# Patient Record
Sex: Male | Born: 1945 | Race: Black or African American | Hispanic: No | Marital: Single | Smoking: Current every day smoker
Health system: Southern US, Community
[De-identification: ages and names within clinical notes are randomized; demographics above are authoritative.]

## PROBLEM LIST (undated history)

## (undated) DIAGNOSIS — I509 Heart failure, unspecified: Secondary | ICD-10-CM

## (undated) DIAGNOSIS — I10 Essential (primary) hypertension: Secondary | ICD-10-CM

## (undated) DIAGNOSIS — B159 Hepatitis A without hepatic coma: Secondary | ICD-10-CM

## (undated) HISTORY — DX: Heart failure, unspecified: I50.9

## (undated) HISTORY — PX: LACERATION REPAIR: SHX5168

## (undated) HISTORY — PX: APPENDECTOMY: SHX54

---

## 2015-05-27 ENCOUNTER — Emergency Department: Payer: Medicare Other

## 2015-05-27 ENCOUNTER — Inpatient Hospital Stay
Admission: EM | Admit: 2015-05-27 | Discharge: 2015-05-30 | DRG: 291 | Disposition: A | Discharge status: Skilled Nursing Facility | Payer: Medicare Other | Attending: Internal Medicine | Admitting: Internal Medicine

## 2015-05-27 DIAGNOSIS — R55 Syncope and collapse: Secondary | ICD-10-CM

## 2015-05-27 DIAGNOSIS — I420 Dilated cardiomyopathy: Secondary | ICD-10-CM

## 2015-05-27 DIAGNOSIS — B159 Hepatitis A without hepatic coma: Secondary | ICD-10-CM | POA: Diagnosis present

## 2015-05-27 DIAGNOSIS — F1010 Alcohol abuse, uncomplicated: Secondary | ICD-10-CM | POA: Diagnosis present

## 2015-05-27 DIAGNOSIS — R748 Abnormal levels of other serum enzymes: Secondary | ICD-10-CM | POA: Diagnosis present

## 2015-05-27 DIAGNOSIS — I5023 Acute on chronic systolic (congestive) heart failure: Secondary | ICD-10-CM | POA: Diagnosis present

## 2015-05-27 DIAGNOSIS — F17210 Nicotine dependence, cigarettes, uncomplicated: Secondary | ICD-10-CM | POA: Diagnosis present

## 2015-05-27 DIAGNOSIS — I509 Heart failure, unspecified: Secondary | ICD-10-CM

## 2015-05-27 DIAGNOSIS — Z9119 Patient's noncompliance with other medical treatment and regimen: Secondary | ICD-10-CM | POA: Diagnosis not present

## 2015-05-27 DIAGNOSIS — I110 Hypertensive heart disease with heart failure: Secondary | ICD-10-CM | POA: Diagnosis present

## 2015-05-27 DIAGNOSIS — Z6820 Body mass index (BMI) 20.0-20.9, adult: Secondary | ICD-10-CM | POA: Diagnosis not present

## 2015-05-27 DIAGNOSIS — R0600 Dyspnea, unspecified: Secondary | ICD-10-CM

## 2015-05-27 DIAGNOSIS — R7989 Other specified abnormal findings of blood chemistry: Secondary | ICD-10-CM

## 2015-05-27 DIAGNOSIS — I10 Essential (primary) hypertension: Secondary | ICD-10-CM

## 2015-05-27 DIAGNOSIS — R778 Other specified abnormalities of plasma proteins: Secondary | ICD-10-CM

## 2015-05-27 DIAGNOSIS — I739 Peripheral vascular disease, unspecified: Secondary | ICD-10-CM | POA: Diagnosis present

## 2015-05-27 DIAGNOSIS — E876 Hypokalemia: Secondary | ICD-10-CM

## 2015-05-27 DIAGNOSIS — E43 Unspecified severe protein-calorie malnutrition: Secondary | ICD-10-CM | POA: Diagnosis present

## 2015-05-27 DIAGNOSIS — J159 Unspecified bacterial pneumonia: Secondary | ICD-10-CM | POA: Diagnosis present

## 2015-05-27 DIAGNOSIS — Z720 Tobacco use: Secondary | ICD-10-CM

## 2015-05-27 HISTORY — DX: Essential (primary) hypertension: I10

## 2015-05-27 HISTORY — DX: Hepatitis a without hepatic coma: B15.9

## 2015-05-27 LAB — COMPREHENSIVE METABOLIC PANEL
ALK PHOS: 52 U/L (ref 38–126)
ALT: 21 U/L (ref 17–63)
AST: 38 U/L (ref 15–41)
Albumin: 3.1 g/dL — ABNORMAL LOW (ref 3.5–5.0)
Anion gap: 6 (ref 5–15)
BILIRUBIN TOTAL: 0.9 mg/dL (ref 0.3–1.2)
BUN: 23 mg/dL — AB (ref 6–20)
CO2: 30 mmol/L (ref 22–32)
Calcium: 8.9 mg/dL (ref 8.9–10.3)
Chloride: 102 mmol/L (ref 101–111)
Creatinine, Ser: 0.84 mg/dL (ref 0.61–1.24)
Glucose, Bld: 90 mg/dL (ref 65–99)
Potassium: 4.2 mmol/L (ref 3.5–5.1)
Sodium: 138 mmol/L (ref 135–145)
Total Protein: 7.1 g/dL (ref 6.5–8.1)

## 2015-05-27 LAB — URINALYSIS COMPLETE WITH MICROSCOPIC (ARMC ONLY)
BACTERIA UA: NONE SEEN
Bilirubin Urine: NEGATIVE
Glucose, UA: NEGATIVE mg/dL
KETONES UR: NEGATIVE mg/dL
Leukocytes, UA: NEGATIVE
NITRITE: NEGATIVE
PH: 6 (ref 5.0–8.0)
PROTEIN: 100 mg/dL — AB
Specific Gravity, Urine: 1.023 (ref 1.005–1.030)

## 2015-05-27 LAB — CBC
HCT: 40.4 % (ref 40.0–52.0)
HEMOGLOBIN: 13.6 g/dL (ref 13.0–18.0)
MCH: 32 pg (ref 26.0–34.0)
MCHC: 33.6 g/dL (ref 32.0–36.0)
MCV: 95.1 fL (ref 80.0–100.0)
Platelets: 81 10*3/uL — ABNORMAL LOW (ref 150–440)
RBC: 4.24 MIL/uL — AB (ref 4.40–5.90)
RDW: 15.8 % — ABNORMAL HIGH (ref 11.5–14.5)
WBC: 5.4 10*3/uL (ref 3.8–10.6)

## 2015-05-27 LAB — CREATININE, SERUM: CREATININE: 0.91 mg/dL (ref 0.61–1.24)

## 2015-05-27 LAB — CBC WITH DIFFERENTIAL/PLATELET
Basophils Absolute: 0 10*3/uL (ref 0–0.1)
Eosinophils Absolute: 0 10*3/uL (ref 0–0.7)
Eosinophils Relative: 0 %
HEMATOCRIT: 37.7 % — AB (ref 40.0–52.0)
HEMOGLOBIN: 12.1 g/dL — AB (ref 13.0–18.0)
LYMPHS ABS: 1.3 10*3/uL (ref 1.0–3.6)
MCH: 30.8 pg (ref 26.0–34.0)
MCHC: 32.1 g/dL (ref 32.0–36.0)
MCV: 96 fL (ref 80.0–100.0)
Monocytes Absolute: 0.5 10*3/uL (ref 0.2–1.0)
NEUTROS ABS: 2.3 10*3/uL (ref 1.4–6.5)
Platelets: 68 10*3/uL — ABNORMAL LOW (ref 150–440)
RBC: 3.93 MIL/uL — ABNORMAL LOW (ref 4.40–5.90)
RDW: 16 % — ABNORMAL HIGH (ref 11.5–14.5)
WBC: 4.1 10*3/uL (ref 3.8–10.6)

## 2015-05-27 LAB — BRAIN NATRIURETIC PEPTIDE: B Natriuretic Peptide: 1394 pg/mL — ABNORMAL HIGH (ref 0.0–100.0)

## 2015-05-27 LAB — TROPONIN I
TROPONIN I: 0.08 ng/mL — AB (ref <0.031)
Troponin I: 0.09 ng/mL — ABNORMAL HIGH (ref <0.031)
Troponin I: 0.09 ng/mL — ABNORMAL HIGH (ref <0.031)

## 2015-05-27 MED ORDER — CARVEDILOL 3.125 MG PO TABS
3.1250 mg | ORAL_TABLET | Freq: Two times a day (BID) | ORAL | Status: DC
  Administered 2015-05-28 (×2): 3.125 mg via ORAL
  Filled 2015-05-27 (×2): qty 1

## 2015-05-27 MED ORDER — DOCUSATE SODIUM 100 MG PO CAPS
100.0000 mg | ORAL_CAPSULE | Freq: Two times a day (BID) | ORAL | Status: DC
  Administered 2015-05-27 – 2015-05-30 (×6): 100 mg via ORAL
  Filled 2015-05-27 (×6): qty 1

## 2015-05-27 MED ORDER — ONDANSETRON HCL 4 MG/2ML IJ SOLN
4.0000 mg | Freq: Four times a day (QID) | INTRAMUSCULAR | Status: DC | PRN
  Administered 2015-05-28 – 2015-05-30 (×5): 4 mg via INTRAVENOUS
  Filled 2015-05-27 (×5): qty 2

## 2015-05-27 MED ORDER — CLOPIDOGREL BISULFATE 75 MG PO TABS
300.0000 mg | ORAL_TABLET | Freq: Once | ORAL | Status: AC
  Administered 2015-05-27: 300 mg via ORAL
  Filled 2015-05-27: qty 4

## 2015-05-27 MED ORDER — SODIUM CHLORIDE 0.9 % IJ SOLN
3.0000 mL | INTRAMUSCULAR | Status: DC | PRN
  Administered 2015-05-29 – 2015-05-30 (×3): 3 mL via INTRAVENOUS
  Filled 2015-05-27 (×3): qty 10

## 2015-05-27 MED ORDER — ONDANSETRON HCL 4 MG PO TABS: 4.0000 mg | ORAL_TABLET | Freq: Four times a day (QID) | ORAL | Status: DC | PRN

## 2015-05-27 MED ORDER — ADULT MULTIVITAMIN W/MINERALS CH
1.0000 | ORAL_TABLET | Freq: Every day | ORAL | Status: DC
  Administered 2015-05-27 – 2015-05-30 (×4): 1 via ORAL
  Filled 2015-05-27 (×4): qty 1

## 2015-05-27 MED ORDER — LORAZEPAM 0.5 MG PO TABS
0.5000 mg | ORAL_TABLET | Freq: Once | ORAL | Status: AC
  Administered 2015-05-27: 0.5 mg via ORAL
  Filled 2015-05-27: qty 1

## 2015-05-27 MED ORDER — SODIUM CHLORIDE 0.9 % IV SOLN: Freq: Once | INTRAVENOUS | Status: DC

## 2015-05-27 MED ORDER — SODIUM CHLORIDE 0.9 % IV SOLN: 250.0000 mL | INTRAVENOUS | Status: DC | PRN

## 2015-05-27 MED ORDER — ENOXAPARIN SODIUM 40 MG/0.4ML ~~LOC~~ SOLN: 40.0000 mg | SUBCUTANEOUS | Status: DC

## 2015-05-27 MED ORDER — ASPIRIN 81 MG PO CHEW
324.0000 mg | CHEWABLE_TABLET | Freq: Once | ORAL | Status: AC
  Administered 2015-05-27: 324 mg via ORAL
  Filled 2015-05-27: qty 4

## 2015-05-27 MED ORDER — FUROSEMIDE 10 MG/ML IJ SOLN
20.0000 mg | Freq: Two times a day (BID) | INTRAMUSCULAR | Status: DC
  Administered 2015-05-27 – 2015-05-29 (×4): 20 mg via INTRAVENOUS
  Filled 2015-05-27 (×4): qty 2

## 2015-05-27 MED ORDER — ACETAMINOPHEN 325 MG PO TABS
650.0000 mg | ORAL_TABLET | Freq: Four times a day (QID) | ORAL | Status: DC | PRN
  Administered 2015-05-27: 650 mg via ORAL
  Filled 2015-05-27: qty 2

## 2015-05-27 MED ORDER — ACETAMINOPHEN 650 MG RE SUPP: 650.0000 mg | Freq: Four times a day (QID) | RECTAL | Status: DC | PRN

## 2015-05-27 MED ORDER — SENNOSIDES-DOCUSATE SODIUM 8.6-50 MG PO TABS: 1.0000 | ORAL_TABLET | Freq: Every evening | ORAL | Status: DC | PRN

## 2015-05-27 MED ORDER — NITROGLYCERIN 2 % TD OINT
0.5000 [in_us] | TOPICAL_OINTMENT | Freq: Once | TRANSDERMAL | Status: AC
  Administered 2015-05-27: 0.5 [in_us] via TOPICAL
  Filled 2015-05-27: qty 1

## 2015-05-27 MED ORDER — FUROSEMIDE 10 MG/ML IJ SOLN
40.0000 mg | Freq: Once | INTRAMUSCULAR | Status: AC
Start: 2015-05-27 — End: 2015-05-27
  Administered 2015-05-27: 40 mg via INTRAVENOUS
  Filled 2015-05-27: qty 4

## 2015-05-27 MED ORDER — SODIUM CHLORIDE 0.9 % IJ SOLN
3.0000 mL | Freq: Two times a day (BID) | INTRAMUSCULAR | Status: DC
  Administered 2015-05-27 – 2015-05-30 (×6): 3 mL via INTRAVENOUS

## 2015-05-27 MED ORDER — ALUM & MAG HYDROXIDE-SIMETH 200-200-20 MG/5ML PO SUSP: 30.0000 mL | Freq: Four times a day (QID) | ORAL | Status: DC | PRN

## 2015-05-27 MED ORDER — ENOXAPARIN SODIUM 80 MG/0.8ML ~~LOC~~ SOLN
1.0000 mg/kg | Freq: Once | SUBCUTANEOUS | Status: AC
  Administered 2015-05-27: 65 mg via SUBCUTANEOUS
  Filled 2015-05-27: qty 0.8

## 2015-05-27 MED ORDER — LISINOPRIL 5 MG PO TABS
5.0000 mg | ORAL_TABLET | Freq: Every day | ORAL | Status: DC
  Administered 2015-05-27 – 2015-05-28 (×2): 5 mg via ORAL
  Filled 2015-05-27 (×2): qty 1

## 2015-05-27 NOTE — ED Notes (Signed)
Patient transported to CT 

## 2015-05-27 NOTE — ED Notes (Signed)
Pt bib EMS from home w/ c/o syncopable episode.  Per EMS, pt was at kitchen table when family reports he LOC and began drooling.  Pt sts that he fell asleep.  Per EMS, family was DC from [facility] yesterday for HTN.  Pt denies pain and sts that he simply fell asleep.  Pt sts he does not remember losing consciousness or family calling EMS.

## 2015-05-27 NOTE — ED Provider Notes (Signed)
[facility] Emergency Department Provider Note  ____________________________________________  Time seen: Approximately 12:05 PM  I have reviewed the triage vital signs and the nursing notes.   HISTORY  Chief Complaint Loss of Consciousness    HPI Alex Hill is a 69 y.o. male who came in from home. Reportedly he was sitting at the table had finished eating and slumped over and began drooling. I'm uncertain as to how long he was out. Patient himself reports that he just wanted to sleep and had gone to sleep. Patient reports he drools when he sleeps sometimes. As I understand that he was not confused when he woke up. History is somewhat limited by the lack of any witnesses at the present time. Patient was a recent discharge from the [place] for hypertension. Patient denies any shortness of breath chest pain or any other discomfort anytime the last week or 2. Patient does report his legs been swelling for about the last 2 weeks. Patient reports she has not done this before. Nothing seemed to bring it on he had no premonitory symptoms except that he felt sleepy. He had no other associated symptoms.  Past Medical History  Diagnosis Date  . Hypertension   . Hepatitis A     There are no active problems to display for this patient.   Past Surgical History  Procedure Laterality Date  . Appendectomy      No current outpatient prescriptions on file.  Allergies Review of patient's allergies indicates no known allergies.  No family history on file.  Social History Social History  Substance Use Topics  . Smoking status: Current Every Day Smoker -- 1.00 packs/day    Types: Cigarettes  . Smokeless tobacco: None  . Alcohol Use: None    Review of Systems Constitutional: No fever/chills Eyes: No visual changes. ENT: No sore throat. Cardiovascular: Denies chest pain. Respiratory: Denies shortness of breath. Gastrointestinal: No abdominal pain.  No nausea,  no vomiting.  No diarrhea.  No constipation. Genitourinary: Negative for dysuria. Musculoskeletal: Negative for back pain. Skin: Negative for rash. Neurological: Negative for headaches, focal weakness or numbness.  10-point ROS otherwise negative.  ____________________________________________   PHYSICAL EXAM:  VITAL SIGNS: ED Triage Vitals  Enc Vitals Group     BP 05/27/15 1157 143/79 mmHg     Pulse Rate 05/27/15 1157 80     Resp 05/27/15 1157 25     Temp 05/27/15 1157 98.4 F (36.9 C)     Temp Source 05/27/15 1157 Oral     SpO2 05/27/15 1157 100 %     Weight 05/27/15 1157 145 lb (65.772 kg)     Height --      Head Cir --      Peak Flow --      Pain Score --      Pain Loc --      Pain Edu? --      Excl. in GC? --     Constitutional: Alert and oriented. Well appearing and in no acute distress. Eyes: Conjunctivae are normal. PERRL. EOMI. Head: Atraumatic. Nose: No congestion/rhinnorhea. Mouth/Throat: Mucous membranes are moist.  Oropharynx non-erythematous. Neck: No stridor.  Cardiovascular: Normal rate, regular rhythm. Grossly normal heart sounds.  Good peripheral circulation. Respiratory: Normal respiratory effort.  No retractions. Lungs CTAB. Gastrointestinal: Soft and nontender. No distention. No abdominal bruits. No CVA tenderness. Musculoskeletal: No lower extremity tenderness nor edema.  No joint effusions. Neurologic:  Normal speech and language. No gross focal neurologic deficits are  appreciated. Cranial nerves II through XII appear to be intact cerebellar finger-nose is perhaps slightly slowed but rapid alternating movements are normal the cerebellar signs are symmetrical bilaterally. There is no motor weakness. There are no sensory changes. No gait instability. Skin:  Skin is warm, dry and intact. No rash noted. Psychiatric: Mood and affect are normal. Speech and behavior are normal.  ____________________________________________   LABS (all labs ordered are  listed, but only abnormal results are displayed)  Labs Reviewed  COMPREHENSIVE METABOLIC PANEL - Abnormal; Notable for the following:    BUN 23 (*)    Albumin 3.1 (*)    All other components within normal limits  TROPONIN I - Abnormal; Notable for the following:    Troponin I 0.09 (*)    All other components within normal limits  CBC WITH DIFFERENTIAL/PLATELET - Abnormal; Notable for the following:    RBC 3.93 (*)    Hemoglobin 12.1 (*)    HCT 37.7 (*)    RDW 16.0 (*)    Platelets 68 (*)    All other components within normal limits  URINALYSIS COMPLETEWITH MICROSCOPIC ([facility] ONLY) - Abnormal; Notable for the following:    Color, Urine AMBER (*)    APPearance CLEAR (*)    Hgb urine dipstick 1+ (*)    Protein, ur 100 (*)    Squamous Epithelial / LPF 0-5 (*)    All other components within normal limits  BRAIN NATRIURETIC PEPTIDE - Abnormal; Notable for the following:    B Natriuretic Peptide 1394.0 (*)    All other components within normal limits  TROPONIN I - Abnormal; Notable for the following:    Troponin I 0.09 (*)    All other components within normal limits   ____________________________________________  EKG  EKG read and interpreted by me shows normal sinus rhythm a rate of 91 normal axis patient has ST segment depression T-wave inversion in II, III, and F with ST elevation in V3 only. Patient also has LVH. No old EKGs are present chest x-ray EKG #2 read and interpreted by me shows normal sinus rhythm rate of 90 normal axis some improvement of the ST segment depression and T-wave inversion inferiorly. The ST elevation in V3 only remains present. ___________________________________________  RADIOLOGY  Chest x-ray read and interpreted by me shows congestive heart failure Patient's head CT showed no acute disease ____________________________________________   PROCEDURES     ____________________________________________   INITIAL IMPRESSION / ASSESSMENT AND PLAN /  ED COURSE  Pertinent labs & imaging results that were available during my care of the patient were reviewed by me and considered in my medical decision making (see chart for details). [facility] it was not able to do this. Patient's troponin was elevated patient's had said his legs began swelling 2 weeks ago still unable to contact the [facility] when I asked him again. We'll attempt to transfer the patient ----------------------------------------- 5:44 PM on 05/27/2015 -----------------------------------------  Patient now says he does not want to go the [facility] would rather stay here to much more convenient for his family. He understands and his family understands that the [facility] might not pay for his stay here. ____________________________________________   FINAL CLINICAL IMPRESSION(S) / ED DIAGNOSES  Final diagnoses:  Acute congestive heart failure, unspecified congestive heart failure type (HCC)  Elevated troponin  Collapse      Arnaldo NatalPaul F Malinda, MD 05/27/15 1744

## 2015-05-27 NOTE — H&P (Signed)
[facility]   PATIENT NAME: Alex Hill    MR#:  [id number]  DATE OF BIRTH:  Dec 22, 1945  DATE OF ADMISSION:  05/27/2015  [facility]   REQUESTING/REFERRING PHYSICIAN: DR Alphonzo Lemmings  CHIEF COMPLAINT:  Shortness of breath leg swelling on and off for 1 month  HISTORY OF PRESENT ILLNESS:  Alex Hill  is a 69 y.o. male with a known history of uncontrolled hypertension not on any medication secondary to noncompliance, alcohol abuse, tobacco abuse comes to the emergency room brought in by daughter and niece with increasing shortness of breath and bilateral lower extremity swelling. Patient was just admitted at the interim apparently he left AMA and does not have any blood pressure medications that as prescribed. He has been noncompliant to primary care physician follow-up per family.  In the ER patient was found to have elevated BNP chest x-ray congestive heart failure along with bilateral lower extremity edema is consistent with new onset congestive heart failure unknown ejection fraction. Patient was also noted to have elevated blood pressure. He received IV Lasix in the emergency room. He is a borderline elevated troponin of 0.09. Denies any chest pain at present.   PAST MEDICAL HISTORY:   Past Medical History  Diagnosis Date  . Hypertension   . Hepatitis A     PAST SURGICAL HISTOIRY:   Past Surgical History  Procedure Laterality Date  . Appendectomy      SOCIAL HISTORY:   Social History  Substance Use Topics  . Smoking status: Current Every Day Smoker -- 1.00 packs/day    Types: Cigarettes  . Smokeless tobacco: Not on file  . Alcohol Use: Not on file    FAMILY HISTORY:  No family history on file.  DRUG ALLERGIES:  No Known Allergies  REVIEW OF SYSTEMS:  Review of Systems  Constitutional: Positive for malaise/fatigue. Negative for fever, chills and weight loss.  HENT: Negative for ear  discharge, ear pain and nosebleeds.   Eyes: Negative for blurred vision, pain and discharge.  Respiratory: Positive for shortness of breath. Negative for sputum production, wheezing and stridor.   Cardiovascular: Positive for leg swelling. Negative for chest pain, palpitations, orthopnea and PND.  Gastrointestinal: Negative for nausea, vomiting, abdominal pain and diarrhea.  Genitourinary: Negative for urgency and frequency.  Musculoskeletal: Negative for back pain and joint pain.  Neurological: Positive for weakness. Negative for sensory change, speech change and focal weakness.  Psychiatric/Behavioral: Negative for depression and hallucinations. The patient is not nervous/anxious.   All other systems reviewed and are negative.    MEDICATIONS AT HOME:   Prior to Admission medications   Not on File      VITAL SIGNS:  Blood pressure 158/57, pulse 90, temperature 98.4 F (36.9 C), temperature source Oral, resp. rate 39, weight 65.772 kg (145 lb), SpO2 100 %.  PHYSICAL EXAMINATION:  GENERAL:  69 y.o.-year-old patient lying in the bed with no acute distress. Appears weak and he'll  EYES: Pupils equal, round, reactive to light and accommodation. No scleral icterus. Extraocular muscles intact.  HEENT: Head atraumatic, normocephalic. Oropharynx and nasopharynx clear.  NECK:  Supple, no jugular venous distention. No thyroid enlargement, no tenderness.  LUNGS: Decreased breath sounds bilaterally, no wheezing, rales,rhonchi or crepitation. No use of accessory muscles of respiration.  CARDIOVASCULAR: S1, S2 normal. No murmurs, rubs, or gallops.  ABDOMEN: Soft, nontender, nondistended. Bowel sounds present. No organomegaly or mass.  EXTREMITIES: +++ pedal edema, no cyanosis, or  clubbing.  NEUROLOGIC: Cranial nerves II through XII are intact. Muscle strength 5/5 in all extremities. Sensation intact. Gait not checked.  PSYCHIATRIC:  patient is alert and oriented x 3.  SKIN: No obvious rash,  lesion, or ulcer.   LABORATORY PANEL:   CBC  Recent Labs Lab 05/27/15 1155  WBC 4.1  HGB 12.1*  HCT 37.7*  PLT 68*   Chemistries   Recent Labs Lab 05/27/15 1155  NA 138  K 4.2  CL 102  CO2 30  GLUCOSE 90  BUN 23*  CREATININE 0.84  CALCIUM 8.9  AST 38  ALT 21  ALKPHOS 52  BILITOT 0.9   ------------------------------------------------------------------------------------------------------------------  Cardiac Enzymes  Recent Labs Lab 05/27/15 1613  TROPONINI 0.09*   ------------------------------------------------------------------------------------------------------------------  RADIOLOGY:  Ct Head Wo Contrast  05/27/2015  CLINICAL DATA:  Syncope and right facial droop EXAM: CT HEAD WITHOUT CONTRAST TECHNIQUE: Contiguous axial images were obtained from the base of the skull through the vertex without intravenous contrast. COMPARISON:  None. FINDINGS: Skull and Sinuses:Negative for fracture or destructive process. The mastoids, middle ears, and imaged paranasal sinuses are clear. Orbits: No acute abnormality. Brain: No evidence of acute infarction, hemorrhage, hydrocephalus, or mass lesion/mass effect. Notable given history of syncope, note that there is moderate streak artifact across the pons, limiting evaluation. Moderate for age low-density in the bilateral cerebral white matter consistent with chronic small vessel disease in this patient with hypertension and smoking history. IMPRESSION: 1. No acute finding. 2. Extensive chronic microvascular ischemia. Electronically Signed   By: Marnee SpringJonathon  Watts M.D.   On: 05/27/2015 13:36   Dg Chest Portable 1 View  05/27/2015  CLINICAL DATA:  Syncope, hypertension, smoker EXAM: PORTABLE CHEST 1 VIEW COMPARISON:  Portable exam 1333 hours without priors for comparison. FINDINGS: Slight rotation to the LEFT. Enlargement of cardiac silhouette. Atherosclerotic calcification aorta. Mediastinal contours and pulmonary vascularity  otherwise normal. Question mild atelectasis versus infiltrate at medial RIGHT lung base. Lungs otherwise clear. No pleural effusion or pneumothorax. IMPRESSION: Enlargement of cardiac silhouette. Questionable mild atelectasis versus infiltrate at medial RIGHT lung base. Electronically Signed   By: Ulyses SouthwardMark  Boles M.D.   On: 05/27/2015 13:50    EKG:  NSR, LVH, Atrial premature beats  IMPRESSION AND PLAN:   Alex FreibergHerbert Hill  is a 69 y.o. male with a known history of uncontrolled hypertension not on any medication secondary to noncompliance, alcohol abuse, tobacco abuse comes to the emergency room brought in by daughter and niece with increasing shortness of breath and bilateral lower extremity swelling.   1. Acute congestive heart failure unknown EF suspected due to hypertensive heart disease in the setting of uncontrolled blood pressure Admit patient to telemetry Cycle cardiac enzymes 3 IV Lasix 20 mg twice a day monitor I's and O's Start patient on when necessary nitroglycerin, by mouth Coreg, by mouth lisinopril Echo of the heart Cardiac consultation if needed 2 g sodium diet Consider heart failure clinic appointment  2. 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 Alcohol abuse Patient last drink was about 6 days ago Monitor for signs of withdrawal. Patient could have alcohol and does cardio myopathy as well Multivitamin by mouth  4. Tobacco abuse 4 minutes spent counseling smoking cessation patient does not appear to be motivated.  5. Generalized weakness and deconditioning Physical therapy Management for discharge planning    All the records are reviewed and case discussed with ED  provider. Management plans discussed with the patient, family daughter and niece and they are in agreement.  CODE STATUS: Full  TOTAL TIME TAKING CARE OF THIS  PATIENT: 50 minutes.    Paislynn Hegstrom M.D on 05/27/2015 at 6:11 PM  Between 7am to 6pm - Pager - [phone]  After 6pm go to [email or web address] - password [email or web address] [facility]  [facility] [facility]  Office  [phone]  CC: Primary care physician; No primary care provider on file.

## 2015-05-27 NOTE — ED Notes (Signed)
Pt with increased work of breathing dr Juliette Alcidemelinda informed repeat ekg completed

## 2015-05-27 NOTE — ED Notes (Signed)
lizn

## 2015-05-28 ENCOUNTER — Inpatient Hospital Stay
Admit: 2015-05-28 | Discharge: 2015-05-28 | Disposition: A | Discharge status: Home / Self Care | Payer: Medicare Other | Attending: Internal Medicine | Admitting: Internal Medicine

## 2015-05-28 LAB — BASIC METABOLIC PANEL
Anion gap: 9 (ref 5–15)
BUN: 20 mg/dL (ref 6–20)
CO2: 30 mmol/L (ref 22–32)
CREATININE: 0.8 mg/dL (ref 0.61–1.24)
Calcium: 9 mg/dL (ref 8.9–10.3)
Chloride: 98 mmol/L — ABNORMAL LOW (ref 101–111)
GFR calc Af Amer: >60 mL/min (ref >60)
GLUCOSE: 92 mg/dL (ref 65–99)
POTASSIUM: 3 mmol/L — AB (ref 3.5–5.1)
Sodium: 137 mmol/L (ref 135–145)

## 2015-05-28 LAB — TROPONIN I
Troponin I: 0.12 ng/mL — ABNORMAL HIGH (ref <0.031)
Troponin I: 0.12 ng/mL — ABNORMAL HIGH (ref <0.031)

## 2015-05-28 MED ORDER — LISINOPRIL 20 MG PO TABS
20.0000 mg | ORAL_TABLET | Freq: Every day | ORAL | Status: DC
  Administered 2015-05-29 – 2015-05-30 (×2): 20 mg via ORAL
  Filled 2015-05-28 (×2): qty 1

## 2015-05-28 MED ORDER — POTASSIUM CHLORIDE CRYS ER 20 MEQ PO TBCR
40.0000 meq | EXTENDED_RELEASE_TABLET | Freq: Two times a day (BID) | ORAL | Status: DC
  Administered 2015-05-28 – 2015-05-30 (×6): 40 meq via ORAL
  Filled 2015-05-28 (×6): qty 2

## 2015-05-28 MED ORDER — POTASSIUM CHLORIDE CRYS ER 20 MEQ PO TBCR
40.0000 meq | EXTENDED_RELEASE_TABLET | Freq: Once | ORAL | Status: AC
  Administered 2015-05-28: 40 meq via ORAL
  Filled 2015-05-28: qty 2

## 2015-05-28 MED ORDER — ENSURE ENLIVE PO LIQD
237.0000 mL | Freq: Two times a day (BID) | ORAL | Status: DC
  Administered 2015-05-28 – 2015-05-30 (×3): 237 mL via ORAL

## 2015-05-28 NOTE — Evaluation (Signed)
Physical Therapy Evaluation Patient Details Name: Alex FreibergHerbert Hill MRN: [id number] DOB: 11/25/1945 Today's Date: 05/28/2015   History of Present Illness  Pt is a 69 yo male who was admitted to the hospital with SOB and bilateral LE swelling.   Clinical Impression  Pt presents with hx of HTN, hepatitis A, and CHF. Nursing cleared pt for PT consultation prior to session after PT inquiry about elevated troponin. Examination reveals that pt performs bed mobility min assist, transfers at min assist, and is unable to perform ambulation secondary to fatigue while in standing. Pt shows fair strength, but is especially weak in LE. During standing he becomes fatigued after about 1 minute of standing and needs to sit relatively immediately. Orthostatics were taken prior to mobility: Supine: 164/67 Sitting: 167/65 Standing: 130/74 (pt non-symptomatic for orthostatic hypotension, just fatigued). Pt also has increased respiration rate with even minor activity and requires ample break time. Pt with no chest pain and in no acute distress during or after session.  Due to pt generalized weakness and decreased activity tolerance, he will benefit from skilled PT in order for him to return to prior level of function and eventually return home safely.     Follow Up Recommendations SNF    Equipment Recommendations  Rolling walker with 5" wheels    Recommendations for Other Services       Precautions / Restrictions Precautions Precautions: Fall      Mobility  Bed Mobility Overal bed mobility: Needs Assistance Bed Mobility: Supine to Sit     Supine to sit: Min assist     General bed mobility comments: Pt requires assist for getting trunk and hips to EOB. Can manage his LEs fairly well but needs cues on where to place hands in order to assist him with sitting up  Transfers Overall transfer level: Needs assistance Equipment used: Rolling walker (2 wheeled) Transfers: Sit to/from Stand Sit to Stand:  Min assist         General transfer comment: Pt needs cues on hand placement prior to transfer. He also needs assist to get fully into standing. Once in standing he is stable with no sway or LOB. Pt fatigues after one minute of standing and needs to sit down rather quickly.   Ambulation/Gait Ambulation/Gait assistance:  (After practicing standing, pt not wishing to go to chair. )           General Gait Details: Not appropriate secondary to pt fatigue this afternoon. Pt respiration increases significantly with even minor mobility (no c/o chest pain throughout mobility and O2 saturation never falls below 95%)  Stairs            Wheelchair Mobility    Modified Rankin (Stroke Patients Only)       Balance Overall balance assessment: No apparent balance deficits (not formally assessed)                                           Pertinent Vitals/Pain Pain Assessment: No/denies pain    Home Living Family/patient expects to be discharged to:: Private residence Living Arrangements: Alone Available Help at Discharge: Family (sister is around but can't help) Type of Home: House Home Access: Stairs to enter Entrance Stairs-Rails: Can reach both Entrance Stairs-Number of Steps: 4 Home Layout: One level Home Equipment: None      Prior Function Level of Independence: Independent  Comments: Per pt, he was able to perform community amb and was indep with ADLs prior to admission     Hand Dominance        Extremity/Trunk Assessment   Upper Extremity Assessment: Overall WFL for tasks assessed           Lower Extremity Assessment: Generalized weakness (Gross LE MMT 3/5)         Communication   Communication: No difficulties  Cognition Arousal/Alertness: Awake/alert Behavior During Therapy: WFL for tasks assessed/performed Overall Cognitive Status: Within Functional Limits for tasks assessed                      General  Comments      Exercises Other Exercises Other Exercises: Pt performs therex x 12 reps bilaterally at supervision for proper technique. Exercises performed: ankle pumps, SLR, hip abd, glute sets, resisted push/pull UE.       Assessment/Plan    PT Assessment Patient needs continued PT services  PT Diagnosis Difficulty walking;Generalized weakness   PT Problem List Decreased strength;Decreased range of motion;Decreased activity tolerance;Decreased mobility;Cardiopulmonary status limiting activity  PT Treatment Interventions DME instruction;Gait training;Stair training;Functional mobility training;Therapeutic activities;Therapeutic exercise;Balance training;Neuromuscular re-education   PT Goals (Current goals can be found in the Care Plan section) Acute Rehab PT Goals Patient Stated Goal: to return home PT Goal Formulation: With patient Time For Goal Achievement: 06/11/15 Potential to Achieve Goals: Good    Frequency Min 2X/week   Barriers to discharge        Co-evaluation               End of Session Equipment Utilized During Treatment: Gait belt Activity Tolerance: Patient tolerated treatment well;Patient limited by fatigue Patient left: in bed;with call bell/phone within reach;with bed alarm set;with family/visitor present Nurse Communication: Mobility status         Time: 1355-1420 PT Time Calculation (min) (ACUTE ONLY): 25 min   Charges:         PT G CodesBenna Dunks 05-30-15, 3:37 PM Benna Dunks, SPT. [phone]

## 2015-05-28 NOTE — Consult Note (Signed)
[facility] CLINIC CARDIOLOGY A [facility]  CARDIOLOGY CONSULT NOTE  Patient ID: Alex Hill MRN: [id number] DOB/AGE: [birth year] 69 y.o.  Admit date: 05/27/2015 Referring Physician [facility] Primary Physician   Primary Cardiologist   Reason for Consultation hypertension and edema  HPI: Pt is a 69 yo male with history of hypertension, etoh abuse, tobacco abuse with noncompliance with all of his medications. CXR revealed posible infiltrate with no pulmonary edema. Pt is a difficult historian but states he has not taken any medicaitons recently. On presentation he was hypertensive and sob.   ROS Review of Systems - History obtained from chart review General ROS: positive for  - fatigue Respiratory ROS: positive for - shortness of breath Cardiovascular ROS: positive for - dyspnea on exertion and edema negative for - chest pain Gastrointestinal ROS: no abdominal pain, change in bowel habits, or black or bloody stools Neurological ROS: no TIA or stroke symptoms   Past Medical History  Diagnosis Date  . Hypertension   . Hepatitis A     No family history on file.  Social History   Social History  . Marital Status: Single    Spouse Name: N/A  . Number of Children: N/A  . Years of Education: N/A   Occupational History  . Not on file.   Social History Main Topics  . Smoking status: Current Every Day Smoker -- 1.00 packs/day    Types: Cigarettes  . Smokeless tobacco: Not on file  . Alcohol Use: Not on file  . Drug Use: Not on file  . Sexual Activity: Not on file   Other Topics Concern  . Not on file   Social History Narrative  . No narrative on file    Past Surgical History  Procedure Laterality Date  . Appendectomy       No prescriptions prior to admission    Physical Exam: Blood pressure 158/55, pulse 78, temperature 97.6 F (36.4 C), temperature source Oral, resp. rate 20, height  (1.803 m), weight 65.772 kg (145 lb), SpO2 98 %.   General  appearance: appears older than stated age Head: atraumatic Resp: diminished breath sounds bilaterally Cardio: regular rate and rhythm GI: soft, non-tender; bowel sounds normal; no masses,  no organomegaly Extremities: extremities normal, atraumatic, no cyanosis or edema Neurologic: Grossly normal Labs:   Lab Results  Component Value Date   WBC 5.4 05/27/2015   HGB 13.6 05/27/2015   HCT 40.4 05/27/2015   MCV 95.1 05/27/2015   PLT 81* 05/27/2015    Recent Labs Lab 05/27/15 1155  05/28/15 0419  NA 138  --  137  K 4.2  --  3.0*  CL 102  --  98*  CO2 30  --  30  BUN 23*  --  20  CREATININE 0.84  < > 0.80  CALCIUM 8.9  --  9.0  PROT 7.1  --   --   BILITOT 0.9  --   --   ALKPHOS 52  --   --   ALT 21  --   --   AST 38  --   --   GLUCOSE 90  --  92  < > = values in this interval not displayed. Lab Results  Component Value Date   TROPONINI 0.12* 05/28/2015      Radiology: cardiomegaly with rll infiltrate vs atelectasis. No pulmonary edema EKG: nsr with  ASSESSMENT AND PLAN:  69 yo male with history of hypertension, etoh abuse, tobacco abuse who was admitted with  proressive sob and edema. He has been off of all of his medications for some time. Does not appear to be in chf. His accelerated hypertension appears to be secondary to noncompliance with meds. WIll resume home meds and counselled regarding compliance and avoidance of tobacco and etoh.  Signed: Dalia HeadingFATH,Kuba Shepherd A. MD, [facility] 05/28/2015, 3:56 PM

## 2015-05-28 NOTE — Care Management Note (Signed)
Case Management Note  Patient Details  Name: Alex FreibergHerbert Hill MRN: [id number] Date of Birth: 05/02/1946  Subjective/Objective:       Patient presents from home with   altered mental status.  [facility]  Drives himself.  Patient acknowledges that he was asked about [facility] transfer on admission and he declined.   His sister in law reports that patient was [facility] .     Patient appeared to lose consciousness and was drooling.    Found to be in chf due to uncontrolled hypertension.  Patient [facility] but does not remember his name.  Says his last appointment was "last Tuesday".  Patient doses off during conversation.  His sister in law Alex Hill says that patient can not go home.  H lives with his sister and she has been in rehab for a couple of weeks.  "There is no one to take care of patient."  She reports that patient does drink and patient's nutrition is poor.  Patient says "it's been a long while since I drank."  Alex Hill says that alcoholism is an ongoing problem.  Physicial therapy has assessed and recommend skilled nursing placement.  Patient is in agreement.     Action/Plan: CSW has assessed  Expected Discharge Date:                  Expected Discharge Plan:   skilled nursing placement  In-House Referral:     Discharge planning Services     Post Acute Care Choice:    Choice offered to:     DME Arranged:    DME Agency:     HH Arranged:    HH Agency:     Status of Service:     Medicare Important Message Given:    Date Medicare IM Given:    Medicare IM give by:    Date Additional Medicare IM Given:    Additional Medicare Important Message give by:     If discussed at Long Length of Stay Meetings, dates discussed:    Additional Comments:  Eber HongGreene, Thaine Garriga R, RN 05/28/2015, 4:01 PM

## 2015-05-28 NOTE — Progress Notes (Signed)
*  PRELIMINARY RESULTS* Echocardiogram 2D Echocardiogram has been performed.  Alex HousekeeperJerry R Hill 05/28/2015, 8:49 AM

## 2015-05-28 NOTE — Progress Notes (Deleted)
Physical Therapy Treatment Patient Details Name: Alex Hill MRN: [id number] DOB: 1945/10/15 Today's Date: 05/28/2015    History of Present Illness Pt is a 69 yo male who was admitted to the hospital with SOB and bilateral LE swelling.     PT Comments    Pt presents with hx of HTN, hepatitis A, and CHF. Nursing cleared pt for PT consultation prior to session after PT inquiry about elevated troponin. Examination reveals that pt performs bed mobility min assist, transfers at min assist, and is unable to perform ambulation secondary to fatigue while in standing. Pt shows fair strength, but is especially weak in LE. During standing he becomes fatigued after about 1 minute of standing and needs to sit relatively immediately. Orthostatics were taken prior to mobility: Supine: 164/67 Sitting: 167/65 Standing: 130/74 (pt non-symptomatic for orthostatic hypotension, just fatigued). Pt also has increased respiration rate with even minor activity and requires ample break time. Pt with no chest pain and in no acute distress during or after session.  Due to pt generalized weakness and decreased activity tolerance, he will benefit from skilled PT in order for him to return to prior level of function and eventually return home safely.   Follow Up Recommendations  SNF     Equipment Recommendations  Rolling walker with 5" wheels    Recommendations for Other Services       Precautions / Restrictions Precautions Precautions: Fall    Mobility  Bed Mobility Overal bed mobility: Needs Assistance Bed Mobility: Supine to Sit     Supine to sit: Min assist     General bed mobility comments: Pt requires assist for getting trunk and hips to EOB. Can manage his LEs fairly well but needs cues on where to place hands in order to assist him with sitting up  Transfers Overall transfer level: Needs assistance Equipment used: Rolling walker (2 wheeled) Transfers: Sit to/from Stand Sit to Stand: Min  assist         General transfer comment: Pt needs cues on hand placement prior to transfer. He also needs assist to get fully into standing. Once in standing he is stable with no sway or LOB. Pt fatigues after one minute of standing and needs to sit down rather quickly.   Ambulation/Gait Ambulation/Gait assistance:  (After practicing standing, pt not wishing to go to chair. )           General Gait Details: Not appropriate secondary to pt fatigue this afternoon. Pt respiration increases significantly with even minor mobility (no c/o chest pain throughout mobility and O2 saturation never falls below 95%)   Stairs            Wheelchair Mobility    Modified Rankin (Stroke Patients Only)       Balance Overall balance assessment: No apparent balance deficits (not formally assessed)                                  Cognition Arousal/Alertness: Awake/alert Behavior During Therapy: WFL for tasks assessed/performed Overall Cognitive Status: Within Functional Limits for tasks assessed                      Exercises Other Exercises Other Exercises: Pt performs therex x 12 reps bilaterally at supervision for proper technique. Exercises performed: ankle pumps, SLR, hip abd, glute sets, resisted push/pull UE.     General Comments  Pertinent Vitals/Pain Pain Assessment: No/denies pain    Home Living Family/patient expects to be discharged to:: Private residence Living Arrangements: Alone Available Help at Discharge: Family (sister is around but can't help) Type of Home: House Home Access: Stairs to enter Entrance Stairs-Rails: Can reach both Home Layout: One level Home Equipment: None      Prior Function Level of Independence: Independent      Comments: Per pt, he was able to perform community amb and was indep with ADLs prior to admission   PT Goals (current goals can now be found in the care plan section) Acute Rehab PT Goals Patient  Stated Goal: to return home PT Goal Formulation: With patient Time For Goal Achievement: 06/11/15 Potential to Achieve Goals: Good    Frequency  Min 2X/week    PT Plan      Co-evaluation             End of Session Equipment Utilized During Treatment: Gait belt Activity Tolerance: Patient tolerated treatment well;Patient limited by fatigue Patient left: in bed;with call bell/phone within reach;with bed alarm set;with family/visitor present     Time: 1355-1420 PT Time Calculation (min) (ACUTE ONLY): 25 min  Charges:                       G CodesBenna Hill:      Alex Hill 05/28/2015, 3:18 PM  Alex Hill, SPT. [phone]

## 2015-05-28 NOTE — Progress Notes (Signed)
Patient was admitted to the unit from the ER d/t c/o SOB and bilateral LE edema. On admission to the unit patient has a foley and was able to transfer from ER stretcher to the unit bed, VS was obtained and admission documentation was completed. Patient was restless , but denied N&V and chest pain. Patient was educated about CHF and care plan during his hospitalization. One time dose of 0.5mg   Ativan was administered per order. Patient rested well for most of the night.  0430: Patient stated that he feel better. Will continue to monitor. Potassium of 3.0 reported to Dr Sheryle Hailiamond. 40meq of potassium PO administered per order. Patient c/o SOB, patient is placed on 2L of oxygen Dr, Sheryle Hailiamond notified .

## 2015-05-28 NOTE — Progress Notes (Signed)
Initial Nutrition Assessment  DOCUMENTATION CODES:   Severe malnutrition in context of chronic illness  INTERVENTION:  Meals and snacks: Cater to pt preferences Medical Nutrition Supplement: Recommend Ensure Enlive po BID, each supplement provides 350 kcal and 20 grams of protein Nutrition diet education: Low sodium diet education left at bedside, pt not feeling well to discuss in detail.  Will follow-up with questions at next visit    NUTRITION DIAGNOSIS:   Inadequate oral intake related to acute illness as evidenced by per patient/family report.    GOAL:   Patient will meet greater than or equal to 90% of their needs    MONITOR:    (Energy intake, Electrolyte and renal profile, )  REASON FOR ASSESSMENT:   Diagnosis    ASSESSMENT:      Pt admitted with CHF, uncontrolled HTN, noted Etoh abuse  Past Medical History  Diagnosis Date  . Hypertension   . Hepatitis A      Current Nutrition: nothing eaten today per pt except applesauce  Food/Nutrition-Related History: pt reports poor po intake for the past 3 days, otherwise normal intake. Suspect intake has been decreased with consumption of alcohol   Scheduled Medications:  . sodium chloride   Intravenous Once  . carvedilol  3.125 mg Oral BID WC  . docusate sodium  100 mg Oral BID  . furosemide  20 mg Intravenous Q12H  . [START ON 05/29/2015] lisinopril  20 mg Oral Daily  . multivitamin with minerals  1 tablet Oral Daily  . potassium chloride  40 mEq Oral BID  . sodium chloride  3 mL Intravenous Q12H        Electrolyte/Renal Profile and Glucose Profile:   Recent Labs Lab 05/27/15 1155 05/27/15 2020 05/28/15 0419  NA 138  --  137  K 4.2  --  3.0*  CL 102  --  98*  CO2 30  --  30  BUN 23*  --  20  CREATININE 0.84 0.91 0.80  CALCIUM 8.9  --  9.0  GLUCOSE 90  --  92   Protein Profile:   Recent Labs Lab 05/27/15 1155  ALBUMIN 3.1*    Gastrointestinal Profile: Last BM:  10/19   Nutrition-Focused Physical Exam Findings: Nutrition-Focused physical exam completed. Findings are normal to severe fat depletion, mild/moderate to severe muscle depletion, and unable to assess edema.      Weight Change: Pt reports weight loss of 20 pounds in the last month (12% weight loss in the last month)    Diet Order:  Diet 2 gram sodium Room service appropriate?: Yes; Fluid consistency:: Thin  Skin:   reviewed    Height:   Ht Readings from Last 1 Encounters:  05/27/15 5\' 11"  (1.803 m)    Weight:   Wt Readings from Last 1 Encounters:  05/27/15 145 lb (65.772 kg)       BMI:  Body mass index is 20.23 kg/(m^2).  Estimated Nutritional Needs:   Kcal:  BEE 1442 kcals (IF 1.0-1.3, AF 1.3) [id number] kcals/d  Protein:  (1.0-1.3 g/kg) 66-86 g/d  Fluid:  (25-5330ml/kg) 1650-196680ml/d  EDUCATION NEEDS:   No education needs identified at this time  HIGH Care Level  Maezie Justin B. Freida BusmanAllen, RD, LDN [phone] (pager)

## 2015-05-28 NOTE — Clinical Social Work Note (Signed)
Clinical Social Work Assessment  Patient Details  Name: Alex Hill MRN: [id number] Date of Birth: 01/06/1946  Date of referral:  05/28/15               Reason for consult:  Facility Placement                Permission sought to share information with:  Facility Medical sales representativeContact Representative, Family Supports Permission granted to share information::  Yes, Verbal Permission Granted  Name::      ( daughter Alex Hill [phone])  Agency::     Relationship::     Contact Information:     Housing/Transportation Living arrangements for the past 2 months:  Single Family Home Source of Information:  Patient Patient Interpreter Needed:  None Criminal Activity/Legal Involvement Pertinent to Current Situation/Hospitalization:  No - Comment as needed Significant Relationships:  Adult Children, Siblings Lives with:  Self Do you feel safe going back to the place where you live?  Yes Need for family participation in patient care:  Yes (Comment)  Care giving concerns:  None at this time   Office managerocial Worker assessment / plan:  Visual merchandiserClinical Social Worker (CSW) in to assess patient for ongoing and disposition needs. Patient is engaged in conversation with CSW, though his speech is often difficult to understand. Patient's sister in-law at bedside, ok for CSW to talk while in the room.     Patient currently lives alone states his sister helps to prepare his meals.     Patient's support system is his daughter [place], per patient she is his Ko OlinaHCPA.   patient stated he enjoyes traveling and does not use any assistance devices at home to ambulate.     Patient evaluated by PT with recommendation for STR.  Patient is in agreement with recommendation.  Patient states he has never been to SNF in the past.  CSW reviewed SNF process with patient, ok for CSW to start a bed search.  CSW will complete FL2, place copy on chart and fax out for available bed.  Employment status:  Disabled (Comment  on whether or not currently receiving Disability) Insurance information:  Medicare PT Recommendations:  Skilled Nursing Facility Information / Referral to community resources:  Skilled Nursing Facility  Patient/Family's Response to care:  Patient is in agreement with plan and will discuss the facilities with his daughter.  Patient/Family's Understanding of and Emotional Response to Diagnosis, Current Treatment, and Prognosis:  Patient understands his is under continued medical work up and will discharge to SNF when medically stable.  Emotional Assessment Appearance:  Appears stated age Attitude/Demeanor/Rapport:    Affect (typically observed):  Accepting, Anxious Orientation:  Oriented to Self, Oriented to Place, Oriented to  Time, Oriented to Situation Alcohol / Substance use:  Tobacco Use Psych involvement (Current and /or in the community):  No (Comment)  Discharge Needs  Concerns to be addressed:  Care Coordination Readmission within the last 30 days:  No Current discharge risk:  Chronically ill, Dependent with Mobility, Lives alone Barriers to Discharge:  Continued Medical Work up, No Barriers Identified   Alex Hill, Raziya Aveni H, LCSW 05/28/2015, 3:26 PM Alex Hill. Alex MajorsLCSWA, MSW Clinical Social Work Department [phone] 3:32 PM

## 2015-05-28 NOTE — Progress Notes (Signed)
Patient is alert and oriented but very weak,Had physical therapy today,BM x2,foley patent and intact without s/s of infection,diuresing well and edema improving.

## 2015-05-28 NOTE — Progress Notes (Signed)
Pt had an emesis episode around 2035. Zofran PRN given. Ginger ale and saltine crackers provided. Daughter at bedside. Will continue to monitor.

## 2015-05-28 NOTE — Progress Notes (Signed)
[facility] - [place] [facility]   PATIENT NAME: Alex Hill    MR#:  [id number]  DATE OF BIRTH:  07-Jul-1946  SUBJECTIVE:  CHIEF COMPLAINT:   Chief Complaint  Patient presents with  . Loss of Consciousness   patient is 69 year old male resisted of hypertension for compliance I'll call as well as tobacco abuse who presented to hospital with complaints of increasing shortness of breath and lower extremity swelling. Patient admits of some cough as cream-colored sputum production. Now off of that he uses he feels good he denies any shortness of breath. He denies having problems with orthopnea or PND. Swelling has subsided. Echocardiogram reveals ejection fraction of 30%, moderate MR. Labs showed a minimal elevation of troponin.   Review of Systems  Constitutional: Negative for fever, chills and weight loss.  HENT: Negative for congestion.   Eyes: Negative for blurred vision and double vision.  Respiratory: Negative for cough, sputum production, shortness of breath and wheezing.   Cardiovascular: Negative for chest pain, palpitations, orthopnea, leg swelling and PND.  Gastrointestinal: Negative for nausea, vomiting, abdominal pain, diarrhea, constipation and blood in stool.  Genitourinary: Negative for dysuria, urgency, frequency and hematuria.  Musculoskeletal: Negative for falls.  Neurological: Negative for dizziness, tremors, focal weakness and headaches.  Endo/Heme/Allergies: Does not bruise/bleed easily.  Psychiatric/Behavioral: Negative for depression. The patient does not have insomnia.     VITAL SIGNS: Blood pressure 158/55, pulse 78, temperature 97.6 F (36.4 C), temperature source Oral, resp. rate 20, height  (1.803 m), weight 65.772 kg (145 lb), SpO2 98 %.  PHYSICAL EXAMINATION:   GENERAL:  69 y.o.-year-old patient lying in the bed with no acute distress. Somewhat somnolent and lying sideways in the bed  EYES: Pupils equal, round,  reactive to light and accommodation. No scleral icterus. Extraocular muscles intact.  HEENT: Head atraumatic, normocephalic. Oropharynx and nasopharynx clear.  NECK:  Supple, no jugular venous distention. No thyroid enlargement, no tenderness.  LUNGS: Normal breath sounds bilaterally, no wheezing, basilar rales,rhonchi , with no crepitation. No use of accessory muscles of respiration.  CARDIOVASCULAR: S1, S2 normal. No murmurs, rubs, or gallops.  ABDOMEN: Soft, nontender, nondistended. Bowel sounds present. No organomegaly or mass.  EXTREMITIES: No pedal edema, cyanosis, or clubbing. No swelling NEUROLOGIC: Cranial nerves II through XII are intact. Muscle strength 5/5 in all extremities. Sensation intact. Gait not checked.  PSYCHIATRIC: The patient is somnolent however awakens quickly with verbal stimuli and oriented x 3.  SKIN: No obvious rash, lesion, or ulcer.   ORDERS/RESULTS REVIEWED:   CBC  Recent Labs Lab 05/27/15 1155 05/27/15 2020  WBC 4.1 5.4  HGB 12.1* 13.6  HCT 37.7* 40.4  PLT 68* 81*  MCV 96.0 95.1  MCH 30.8 32.0  MCHC 32.1 33.6  RDW 16.0* 15.8*  LYMPHSABS 1.3  --   MONOABS 0.5  --   EOSABS 0.0  --   BASOSABS 0.0  --    ------------------------------------------------------------------------------------------------------------------  Chemistries   Recent Labs Lab 05/27/15 1155 05/27/15 2020 05/28/15 0419  NA 138  --  137  K 4.2  --  3.0*  CL 102  --  98*  CO2 30  --  30  GLUCOSE 90  --  92  BUN 23*  --  20  CREATININE 0.84 0.91 0.80  CALCIUM 8.9  --  9.0  AST 38  --   --   ALT 21  --   --   ALKPHOS 52  --   --  BILITOT 0.9  --   --    ------------------------------------------------------------------------------------------------------------------ estimated creatinine clearance is 80 mL/min (by C-G formula based on Cr of 0.8). ------------------------------------------------------------------------------------------------------------------ No  results for input(s): TSH, T4TOTAL, T3FREE, THYROIDAB in the last 72 hours.  Invalid input(s): FREET3  Cardiac Enzymes  Recent Labs Lab 05/27/15 2020 05/28/15 0419 05/28/15 1002  TROPONINI 0.08* 0.12* 0.12*   ------------------------------------------------------------------------------------------------------------------ Invalid input(s): POCBNP ---------------------------------------------------------------------------------------------------------------  RADIOLOGY: Ct Head Wo Contrast  05/27/2015  CLINICAL DATA:  Syncope and right facial droop EXAM: CT HEAD WITHOUT CONTRAST TECHNIQUE: Contiguous axial images were obtained from the base of the skull through the vertex without intravenous contrast. COMPARISON:  None. FINDINGS: Skull and Sinuses:Negative for fracture or destructive process. The mastoids, middle ears, and imaged paranasal sinuses are clear. Orbits: No acute abnormality. Brain: No evidence of acute infarction, hemorrhage, hydrocephalus, or mass lesion/mass effect. Notable given history of syncope, note that there is moderate streak artifact across the pons, limiting evaluation. Moderate for age low-density in the bilateral cerebral white matter consistent with chronic small vessel disease in this patient with hypertension and smoking history. IMPRESSION: 1. No acute finding. 2. Extensive chronic microvascular ischemia. Electronically Signed   By: Marnee SpringJonathon  Watts M.D.   On: 05/27/2015 13:36   Dg Chest Portable 1 View  05/27/2015  CLINICAL DATA:  Syncope, hypertension, smoker EXAM: PORTABLE CHEST 1 VIEW COMPARISON:  Portable exam 1333 hours without priors for comparison. FINDINGS: Slight rotation to the LEFT. Enlargement of cardiac silhouette. Atherosclerotic calcification aorta. Mediastinal contours and pulmonary vascularity otherwise normal. Question mild atelectasis versus infiltrate at medial RIGHT lung base. Lungs otherwise clear. No pleural effusion or pneumothorax.  IMPRESSION: Enlargement of cardiac silhouette. Questionable mild atelectasis versus infiltrate at medial RIGHT lung base. Electronically Signed   By: Ulyses SouthwardMark  Boles M.D.   On: 05/27/2015 13:50    EKG:  Orders placed or performed during the hospital encounter of 05/27/15  . ED EKG  . ED EKG  . EKG 12-Lead  . EKG 12-Lead    ASSESSMENT AND PLAN:  Active Problems:   CHF (congestive heart failure) (HCC) 1. Acute systolic CHF, the patient on ACE inhibitor Lasix and follow ins and outs, get cardiologist involved for further recommendations, patient would benefit from CHF clinic 2.  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 Elevated troponin, possibly demand ischemia, continue aspirin and Plavix nitroglycerin as well as coronary, following cardiologist input 4. Hypokalemia supplemented orally 5. Hypertension poorly controlled, advance lisinopril to 20 mg daily dose 6. Tobacco abuse,  nicotine replacement therapy  Management plans discussed with the patient, family and they are in agreement.   DRUG ALLERGIES: No Known Allergies  CODE STATUS:     Code Status Orders        Start     Ordered   05/27/15 1957  Full code   Continuous     05/27/15 1956      TOTAL TIME TAKING CARE OF THIS PATIENT: 40 minutes.    Katharina CaperVAICKUTE,Katessa Attridge M.D on 05/28/2015 at 1:08 PM  Between 7am to 6pm - Pager - [phone]  After 6pm go to [email or web address] - password [email or web address] [facility]  [place] [facility]  Office  [phone]  CC: Primary care physician; No primary care provider on file.

## 2015-05-29 DIAGNOSIS — Z720 Tobacco use: Secondary | ICD-10-CM

## 2015-05-29 DIAGNOSIS — I420 Dilated cardiomyopathy: Secondary | ICD-10-CM

## 2015-05-29 DIAGNOSIS — E876 Hypokalemia: Secondary | ICD-10-CM

## 2015-05-29 DIAGNOSIS — I5023 Acute on chronic systolic (congestive) heart failure: Secondary | ICD-10-CM

## 2015-05-29 DIAGNOSIS — R778 Other specified abnormalities of plasma proteins: Secondary | ICD-10-CM

## 2015-05-29 DIAGNOSIS — R7989 Other specified abnormal findings of blood chemistry: Secondary | ICD-10-CM

## 2015-05-29 DIAGNOSIS — R0600 Dyspnea, unspecified: Secondary | ICD-10-CM

## 2015-05-29 DIAGNOSIS — E43 Unspecified severe protein-calorie malnutrition: Secondary | ICD-10-CM

## 2015-05-29 DIAGNOSIS — I10 Essential (primary) hypertension: Secondary | ICD-10-CM

## 2015-05-29 MED ORDER — ENOXAPARIN SODIUM 30 MG/0.3ML ~~LOC~~ SOLN: 30.0000 mg | SUBCUTANEOUS | Status: DC

## 2015-05-29 MED ORDER — ENSURE ENLIVE PO LIQD: 237.0000 mL | Freq: Two times a day (BID) | ORAL | Status: DC

## 2015-05-29 MED ORDER — NITROGLYCERIN 2 % TD OINT
1.0000 [in_us] | TOPICAL_OINTMENT | Freq: Four times a day (QID) | TRANSDERMAL | Status: DC
  Administered 2015-05-29 – 2015-05-30 (×5): 1 [in_us] via TOPICAL
  Filled 2015-05-29 (×5): qty 1

## 2015-05-29 MED ORDER — LORAZEPAM 2 MG/ML IJ SOLN
0.5000 mg | INTRAMUSCULAR | Status: DC | PRN
  Administered 2015-05-29 – 2015-05-30 (×4): 0.5 mg via INTRAVENOUS
  Filled 2015-05-29 (×4): qty 1

## 2015-05-29 MED ORDER — CARVEDILOL 6.25 MG PO TABS
6.2500 mg | ORAL_TABLET | Freq: Two times a day (BID) | ORAL | Status: DC
  Administered 2015-05-29 – 2015-05-30 (×3): 6.25 mg via ORAL
  Filled 2015-05-29 (×3): qty 1

## 2015-05-29 MED ORDER — LISINOPRIL 40 MG PO TABS: 40.0000 mg | ORAL_TABLET | Freq: Every day | ORAL | Status: DC

## 2015-05-29 MED ORDER — CARVEDILOL 6.25 MG PO TABS: 6.2500 mg | ORAL_TABLET | Freq: Two times a day (BID) | ORAL | Status: DC

## 2015-05-29 NOTE — Progress Notes (Signed)
Physical Therapy Treatment Patient Details Name: Alex Hill MRN: [id number] DOB: 10-10-1945 Today's Date: 05/29/2015    History of Present Illness Pt is a 69 yo male who was admitted to the hospital with SOB and bilateral LE swelling.     PT Comments    Pt progressing ambulation this date with greater distance and less dyspnea with mobility. He is eager to participate in therapy tasks and has also improved his exercise tolerance. Pt still has gait deficits, strength deficits, and safety concerns with walking and awareness of surroundings. Due to these deficits he will continue to benefit from skilled PT in order to eventually return home safely.   Follow Up Recommendations  SNF     Equipment Recommendations  Rolling walker with 5" wheels    Recommendations for Other Services       Precautions / Restrictions Precautions Precautions: Fall Restrictions Weight Bearing Restrictions: No    Mobility  Bed Mobility Overal bed mobility: Needs Assistance Bed Mobility: Supine to Sit     Supine to sit: Min assist     General bed mobility comments: Pt requires assist for getting trunk and hips to EOB. Can manage his LEs fairly well but needs cues on where to place hands in order to assist him with sitting up  Transfers Overall transfer level: Needs assistance Equipment used: Rolling walker (2 wheeled) Transfers: Sit to/from Stand Sit to Stand: Min assist         General transfer comment: Pt needs cues on hand placement prior to transfer. He also needs assist to get fully into standing. Once in standing he is stable with no sway or LOB. Pt slightly impulsive and needs cues to slow down and wait for therapist    Ambulation/Gait Ambulation/Gait assistance: Min assist Ambulation Distance (Feet): 30 Feet Assistive device: Rolling walker (2 wheeled) Gait Pattern/deviations: Step-through pattern;Decreased step length - right;Decreased step length - left;Narrow base of  support Gait velocity: decreased Gait velocity interpretation: Below normal speed for age/gender General Gait Details: Pt tends to let walker get out in front of him and needs assist to navigate walker around room and to keep it close to COM.    Stairs            Wheelchair Mobility    Modified Rankin (Stroke Patients Only)       Balance Overall balance assessment: No apparent balance deficits (not formally assessed)                                  Cognition Arousal/Alertness: Awake/alert Behavior During Therapy: WFL for tasks assessed/performed Overall Cognitive Status: Within Functional Limits for tasks assessed                      Exercises Other Exercises Other Exercises: Pt performs therex x 15 reps bilaterally at supervision for proper technique. Exercises performed: ankle pumps, SLR, hip abd, glute sets, resisted push/pull UE.     General Comments        Pertinent Vitals/Pain Pain Assessment: No/denies pain    Home Living                      Prior Function            PT Goals (current goals can now be found in the care plan section) Acute Rehab PT Goals Patient Stated Goal: to return home PT Goal Formulation: With  patient Time For Goal Achievement: 06/11/15 Potential to Achieve Goals: Good Progress towards PT goals: Progressing toward goals    Frequency  Min 2X/week    PT Plan Current plan remains appropriate    Co-evaluation             End of Session Equipment Utilized During Treatment: Gait belt Activity Tolerance: Patient tolerated treatment well;Patient limited by fatigue Patient left: in chair;with call bell/phone within reach;with chair alarm set;with SCD's reapplied     Time: 1430-1453 PT Time Calculation (min) (ACUTE ONLY): 23 min  Charges:                       G CodesBenna Dunks:      Alex Hill 05/29/2015, 3:45 PM  Benna Dunksasey Milena Liggett, SPT. [phone]

## 2015-05-29 NOTE — Progress Notes (Addendum)
[facility] - [place] at [facility]   PATIENT NAME: Alex FreibergHerbert Buscemi    MR#:  [id number]  DATE OF BIRTH:  05/15/1946  SUBJECTIVE:  CHIEF COMPLAINT:   Chief Complaint  Patient presents with  . Loss of Consciousness   patient is 69 year old male resisted of hypertension for compliance I'll call as well as tobacco abuse who presented to hospital with complaints of increasing shortness of breath and lower extremity swelling. Patient admits of some cough as cream-colored sputum production. Now off of that he uses he feels good he denies any shortness of breath. He denies having problems with orthopnea or PND. Swelling has subsided. Echocardiogram reveals ejection fraction of 30%, moderate MR. Labs showed a minimal elevation of troponin.  The patient was seen by cardiologist who felt that patient's dyspnea was not due to CHF but most likely due to accelerated hypertension. Patient feels good today. Denies any pain or shortness of breath. He was evaluated by physical therapist and recommended skilled nursing facility placement. Social work was contacted for placement. Review of Systems  Constitutional: Negative for fever, chills and weight loss.  HENT: Negative for congestion.   Eyes: Negative for blurred vision and double vision.  Respiratory: Negative for cough, sputum production, shortness of breath and wheezing.   Cardiovascular: Negative for chest pain, palpitations, orthopnea, leg swelling and PND.  Gastrointestinal: Negative for nausea, vomiting, abdominal pain, diarrhea, constipation and blood in stool.  Genitourinary: Negative for dysuria, urgency, frequency and hematuria.  Musculoskeletal: Negative for falls.  Neurological: Negative for dizziness, tremors, focal weakness and headaches.  Endo/Heme/Allergies: Does not bruise/bleed easily.  Psychiatric/Behavioral: Negative for depression. The patient does not have insomnia.     VITAL SIGNS: Blood pressure 131/71,  pulse 86, temperature 99 F (37.2 C), temperature source Oral, resp. rate 18, height 5\' 11"  (1.803 m), weight 55.339 kg (122 lb), SpO2 97 %.  PHYSICAL EXAMINATION:   GENERAL:  69 y.o.-year-old patient lying in the bed with no acute distress. Somewhat somnolent and lying sideways in the bed  EYES: Pupils equal, round, reactive to light and accommodation. No scleral icterus. Extraocular muscles intact.  HEENT: Head atraumatic, normocephalic. Oropharynx and nasopharynx clear.  NECK:  Supple, no jugular venous distention. No thyroid enlargement, no tenderness.  LUNGS: Normal breath sounds bilaterally, no wheezing, basilar rales,rhonchi , with no crepitation. No use of accessory muscles of respiration.  CARDIOVASCULAR: S1, S2 normal. No murmurs, rubs, or gallops.  ABDOMEN: Soft, nontender, nondistended. Bowel sounds present. No organomegaly or mass.  EXTREMITIES: No pedal edema, cyanosis, or clubbing. No swelling NEUROLOGIC: Cranial nerves II through XII are intact. Muscle strength 5/5 in all extremities. Sensation intact. Gait not checked.  PSYCHIATRIC: The patient is somnolent however awakens quickly with verbal stimuli and oriented x 3.  SKIN: No obvious rash, lesion, or ulcer.   ORDERS/RESULTS REVIEWED:   CBC  Recent Labs Lab 05/27/15 1155 05/27/15 2020  WBC 4.1 5.4  HGB 12.1* 13.6  HCT 37.7* 40.4  PLT 68* 81*  MCV 96.0 95.1  MCH 30.8 32.0  MCHC 32.1 33.6  RDW 16.0* 15.8*  LYMPHSABS 1.3  --   MONOABS 0.5  --   EOSABS 0.0  --   BASOSABS 0.0  --    ------------------------------------------------------------------------------------------------------------------  Chemistries   Recent Labs Lab 05/27/15 1155 05/27/15 2020 05/28/15 0419  NA 138  --  137  K 4.2  --  3.0*  CL 102  --  98*  CO2 30  --  30  GLUCOSE 90  --  92  BUN 23*  --  20  CREATININE 0.84 0.91 0.80  CALCIUM 8.9  --  9.0  AST 38  --   --   ALT 21  --   --   ALKPHOS 52  --   --   BILITOT 0.9  --   --     ------------------------------------------------------------------------------------------------------------------ estimated creatinine clearance is 67.2 mL/min (by C-G formula based on Cr of 0.8). ------------------------------------------------------------------------------------------------------------------ No results for input(s): TSH, T4TOTAL, T3FREE, THYROIDAB in the last 72 hours.  Invalid input(s): FREET3  Cardiac Enzymes  Recent Labs Lab 05/27/15 2020 05/28/15 0419 05/28/15 1002  TROPONINI 0.08* 0.12* 0.12*   ------------------------------------------------------------------------------------------------------------------ Invalid input(s): POCBNP ---------------------------------------------------------------------------------------------------------------  RADIOLOGY: No results found.  EKG:  Orders placed or performed during the hospital encounter of 05/27/15  . ED EKG  . ED EKG  . EKG 12-Lead  . EKG 12-Lead    ASSESSMENT AND PLAN:  Active Problems:   CHF (congestive heart failure) (HCC)   Systolic CHF, acute on chronic (HCC)   Dyspnea   Congestive dilated cardiomyopathy (HCC)   Elevated troponin   Essential hypertension, malignant   Protein-calorie malnutrition, severe   Hypokalemia   Tobacco abuse 1. Chronic systolic CHF, likely reason of dyspnea, continue patient on ACE inhibitor and follow ins and outs, , patient would benefit from CHF clinic 2.  Cardiomyopathy with ejection fraction of 30-35%, likely related to poorly controlled hypertension, appreciate cardiologist recommendations, CHF clinic upon discharge 3. Elevated troponin, possibly demand ischemia, continue aspirin and Plavix,  nitroglycerin , appreciate cardiologist input 4. Hypokalemia supplemented orally 5. Hypertension poorly controlled, better blood pressure readings on advanced  lisinopril dose to 20 mg daily  6. Tobacco abuse,  nicotine replacement therapy 7. Generalized weakness,  skilled nursing facility placement, social workers contacted  Management plans discussed with the patient, family and they are in agreement.   DRUG ALLERGIES: No Known Allergies  CODE STATUS:     Code Status Orders        Start     Ordered   05/27/15 1957  Full code   Continuous     05/27/15 1956      TOTAL TIME TAKING CARE OF THIS PATIENT: 35 minutes.    Katharina Caper M.D on 05/29/2015 at 2:58 PM  Between 7am to 6pm - Pager - [phone]  After 6pm go to [email or web address] - password [email or web address] [facility]  [place] Hospitalists  Office  [phone]  CC: Primary care physician; No primary care provider on file.

## 2015-05-29 NOTE — Progress Notes (Signed)
   05/29/15 1858  Clinical Encounter Type  Visited With Patient and family together  Visit Type Initial  Referral From Family  Consult/Referral To Chaplain  Chaplain visited with patient and daughter and educated them on filling out an Scientist, water qualityAdvanced Directive. I left information for them to fill out and informed them that one of the chaplains will come in the morning to finalize paperwork.   Chaplain Zennie Ayars [phone]

## 2015-05-29 NOTE — Care Management Important Message (Signed)
Important Message  Patient Details  Name: Debby FreibergHerbert Bora MRN: [id number] Date of Birth: 04/06/1946   Medicare Important Message Given:  Yes-second notification given    Olegario MessierKathy A Allmond 05/29/2015, 10:10 AM

## 2015-05-29 NOTE — Progress Notes (Signed)
Foley removed per md orders, pt tolerated well.  Instructed pt to void into urinal

## 2015-05-29 NOTE — Progress Notes (Signed)
Pt's daughter Lacretia LeighBeverly Goins contacted CSW at the request of CSW to follow up regarding dc to SNF for STR. Pt's daughter confirmed that she received the list of available SNFs left in the room. She is familiar with several of them and is going to look into the others to prepare for a possible dc on Friday. CSW will follow up with daughter for choice of SNF on Friday.   Wilford Gristara Kirstan Fentress, LCSW [phone]

## 2015-05-29 NOTE — Progress Notes (Signed)
Pt vomited undigested food, Zofran 4mg  iv given, will monitor.

## 2015-05-29 NOTE — Progress Notes (Signed)
Ativan 0.5mg  iv given per pt request for sleep

## 2015-05-29 NOTE — Progress Notes (Signed)
Resting comfortably,no distress.

## 2015-05-29 NOTE — Progress Notes (Signed)
No further vomiting, pt denies nausea at this time

## 2015-05-30 LAB — BASIC METABOLIC PANEL
Anion gap: 5 (ref 5–15)
BUN: 29 mg/dL — AB (ref 6–20)
CO2: 30 mmol/L (ref 22–32)
Calcium: 8.8 mg/dL — ABNORMAL LOW (ref 8.9–10.3)
Chloride: 98 mmol/L — ABNORMAL LOW (ref 101–111)
Creatinine, Ser: 0.97 mg/dL (ref 0.61–1.24)
Glucose, Bld: 102 mg/dL — ABNORMAL HIGH (ref 65–99)
POTASSIUM: 4.7 mmol/L (ref 3.5–5.1)
SODIUM: 133 mmol/L — AB (ref 135–145)

## 2015-05-30 MED ORDER — IPRATROPIUM-ALBUTEROL 0.5-2.5 (3) MG/3ML IN SOLN: 3.0000 mL | RESPIRATORY_TRACT | Status: DC | PRN

## 2015-05-30 MED ORDER — LEVOFLOXACIN 500 MG PO TABS
500.0000 mg | ORAL_TABLET | Freq: Every day | ORAL | Status: DC
Start: 2015-05-30 — End: 2015-06-13

## 2015-05-30 MED ORDER — LEVOFLOXACIN 500 MG PO TABS
500.0000 mg | ORAL_TABLET | Freq: Every day | ORAL | Status: DC
  Administered 2015-05-30: 500 mg via ORAL
  Filled 2015-05-30: qty 1

## 2015-05-30 MED ORDER — CARVEDILOL 12.5 MG PO TABS: 12.5000 mg | ORAL_TABLET | Freq: Two times a day (BID) | ORAL | Status: DC

## 2015-05-30 NOTE — Progress Notes (Signed)
CSW informed by Dr. Winona LegatoVaickute that patient is medically ready to discharge, Patient and daughter are in a agreement with plan.  Call to SNF [facility] to confirm that patient's bed is ready. Provided patient's room number 154-B and number to call for report [phone] .  CSW updated FL2 and faxed discharge information to facility.  Discharge packet completed.   RN will call report and patient will discharge to [facility] via Daughter transporting to facility.  Sammuel Hineseborah Cyani Kallstrom. Theresia MajorsLCSWA, MSW Clinical Social Work Department [phone] 11:40 AM

## 2015-05-30 NOTE — Discharge Summary (Signed)
[facility] - [place] at [facility]   PATIENT NAME: Alex Hill    MR#:  [id number]  DATE OF BIRTH:  [birth year]  DATE OF ADMISSION:  05/27/2015 ADMITTING PHYSICIAN: Enedina Finner, MD  DATE OF DISCHARGE: 21st of October 2016  PRIMARY CARE PHYSICIAN: No primary care provider on file.     ADMISSION DIAGNOSIS:  Elevated troponin [R79.89] Collapse [R55] Acute congestive heart failure, unspecified congestive heart failure type (HCC) [I50.9]  DISCHARGE DIAGNOSIS:  Principal Problem:   CHF (congestive heart failure) (HCC) Active Problems:   Systolic CHF, acute on chronic (HCC)   Dyspnea   Congestive dilated cardiomyopathy (HCC)   Elevated troponin   Essential hypertension, malignant   Protein-calorie malnutrition, severe   Hypokalemia   Tobacco abuse   SECONDARY DIAGNOSIS:   Past Medical History  Diagnosis Date  . Hypertension   . Hepatitis A     .pro HOSPITAL COURSE:  patient is 69 year old male with past medical history of poorly controlled hypertension , medication noncompliance, alcohol as well as tobacco abuse who presented to hospital with complaints of increasing shortness of breath and lower extremity swelling. Patient admited of some cough and cream-colored sputum production. Patient was admitted to the hospital due to dyspnea. He denied having orthopnea or PND. Chest x-ray revealed enlargement of cardiac silhouette and mild atelectasis versus infiltrate in the medial right lung base . Patient was initiated on Levaquin and diuretics. Swelling has subsided as well as shortness of breath. Patient had an echocardiogram performed due to dyspnea. Echocardiogram revealed ejection fraction of 30%, moderate MR. Labs showed a minimal elevation of troponin.  The patient was seen by cardiologist who felt that patient's dyspnea was not due to CHF but most likely due to accelerated hypertension, blood pressure readings where 180 in the hospital initially.  Patient was given blood pressure medications and his blood pressure improved as well as his dyspnea.  He was evaluated by physical therapist and recommended skilled nursing facility placement. Social work was contacted for placement, patient is being discharged to skilled nursing facility today 21st of October 2016. Patient feels good today. Denies any pain or shortness of breath Discussion by problem .1 Chronic systolic CHF, likely reason of dyspnea, continue patient on ACE inhibitor and follow ins and outs, , patient would benefit from CHF clinic 2. Cardiomyopathy with ejection fraction of 30-35%, likely related to poorly controlled hypertension, appreciate cardiologist recommendations, CHF clinic upon discharge 3. Elevated troponin, possibly demand ischemia, continue aspirin and Plavix, nitroglycerin , appreciate cardiologist input 4. Hypokalemia supplemented orally 5. Hypertension poorly controlled initially, better blood pressure readings on advanced lisinopril dose   6. Tobacco abuse, nicotine replacement therapy 7. Generalized weakness, skilled nursing facility placement, discharged today 8. Right lung base bacterial pneumonia, NOS, continue patient on levofloxacin for 6 more days to complete 7 day course  DISCHARGE CONDITIONS:   Stable  CONSULTS OBTAINED:  Treatment Team:  Dalia Heading, MD  DRUG ALLERGIES:  No Known Allergies  DISCHARGE MEDICATIONS:   Current Discharge Medication List    START taking these medications   Details  carvedilol (COREG) 12.5 MG tablet Take 1 tablet (12.5 mg total) by mouth 2 (two) times daily with a meal. Qty: 60 tablet, Refills: 6    feeding supplement, ENSURE ENLIVE, (ENSURE ENLIVE) LIQD Take 237 mLs by mouth 2 times daily at 12 noon and 4 pm. Qty: 237 mL, Refills: 12    lisinopril (PRINIVIL,ZESTRIL) 40 MG tablet Take 1 tablet (40 mg total)  by mouth daily. Qty: 60 tablet, Refills: 6         DISCHARGE INSTRUCTIONS:    Patient is  to follow-up with [facility] and cardiologist, Dr. Lady Gary as outpatient  If you experience worsening of your admission symptoms, develop shortness of breath, life threatening emergency, suicidal or homicidal thoughts you must seek medical attention immediately by calling 911 or calling your MD immediately  if symptoms less severe.  You Must read complete instructions/literature along with all the possible adverse reactions/side effects for all the Medicines you take and that have been prescribed to you. Take any new Medicines after you have completely understood and accept all the possible adverse reactions/side effects.   Please note  You were cared for by a hospitalist during your hospital stay. If you have any questions about your discharge medications or the care you received while you were in the hospital after you are discharged, you can call the unit and asked to speak with the hospitalist on call if the hospitalist that took care of you is not available. Once you are discharged, your primary care physician will handle any further medical issues. Please note that NO REFILLS for any discharge medications will be authorized once you are discharged, as it is imperative that you return to your primary care physician (or establish a relationship with a primary care physician if you do not have one) for your aftercare needs so that they can reassess your need for medications and monitor your lab values.    Today   CHIEF COMPLAINT:   Chief Complaint  Patient presents with  . Loss of Consciousness    HISTORY OF PRESENT ILLNESS:  Alex Hill  is a 69 y.o. male with a known history of poorly controlled hypertension , medication noncompliance, alcohol as well as tobacco abuse who presented to hospital with complaints of increasing shortness of breath and lower extremity swelling. Patient admited of some cough and cream-colored sputum production. Patient was admitted to the hospital due to  dyspnea. He denied having orthopnea or PND. Chest x-ray revealed enlargement of cardiac silhouette and mild atelectasis versus infiltrate in the medial right lung base . Patient was initiated on Levaquin and diuretics. Swelling has subsided as well as shortness of breath. Patient had an echocardiogram performed due to dyspnea. Echocardiogram revealed ejection fraction of 30%, moderate MR. Labs showed a minimal elevation of troponin.  The patient was seen by cardiologist who felt that patient's dyspnea was not due to CHF but most likely due to accelerated hypertension, blood pressure readings where 180 in the hospital initially. Patient was given blood pressure medications and his blood pressure improved as well as his dyspnea.  He was evaluated by physical therapist and recommended skilled nursing facility placement. Social work was contacted for placement, patient is being discharged to skilled nursing facility today 21st of October 2016. Patient feels good today. Denies any pain or shortness of breath Discussion by problem .1 Chronic systolic CHF, likely reason of dyspnea, continue patient on ACE inhibitor and follow ins and outs, , patient would benefit from CHF clinic 2. Cardiomyopathy with ejection fraction of 30-35%, likely related to poorly controlled hypertension, appreciate cardiologist recommendations, CHF clinic upon discharge 3. Elevated troponin, possibly demand ischemia, continue aspirin and Plavix, nitroglycerin , appreciate cardiologist input 4. Hypokalemia supplemented orally 5. Hypertension poorly controlled initially, better blood pressure readings on advanced lisinopril dose   6. Tobacco abuse, nicotine replacement therapy 7. Generalized weakness, skilled nursing facility placement, discharged today 8.  Right lung base bacterial pneumonia, NOS, continue patient on levofloxacin for 6 more days to complete 7 day course    VITAL SIGNS:  Blood pressure 148/63, pulse 80,  temperature 99 F (37.2 C), temperature source Oral, resp. rate 20, height 5\' 11"  (1.803 m), weight 55.157 kg (121 lb 9.6 oz), SpO2 94 %.  I/O:   Intake/Output Summary (Last 24 hours) at 05/30/15 0931 Last data filed at 05/30/15 0925  Gross per 24 hour  Intake    703 ml  Output    850 ml  Net   -147 ml    PHYSICAL EXAMINATION:  GENERAL:  69 y.o.-year-old patient lying in the bed with no acute distress.  EYES: Pupils equal, round, reactive to light and accommodation. No scleral icterus. Extraocular muscles intact.  HEENT: Head atraumatic, normocephalic. Oropharynx and nasopharynx clear.  NECK:  Supple, no jugular venous distention. No thyroid enlargement, no tenderness.  LUNGS: Normal breath sounds bilaterally, no wheezing, rales,rhonchi or crepitation. No use of accessory muscles of respiration.  CARDIOVASCULAR: S1, S2 normal. No murmurs, rubs, or gallops.  ABDOMEN: Soft, non-tender, non-distended. Bowel sounds present. No organomegaly or mass.  EXTREMITIES: No pedal edema, cyanosis, or clubbing.  NEUROLOGIC: Cranial nerves II through XII are intact. Muscle strength 5/5 in all extremities. Sensation intact. Gait not checked.  PSYCHIATRIC: The patient is alert and oriented x 3.  SKIN: No obvious rash, lesion, or ulcer.   DATA REVIEW:   CBC  Recent Labs Lab 05/27/15 2020  WBC 5.4  HGB 13.6  HCT 40.4  PLT 81*    Chemistries   Recent Labs Lab 05/27/15 1155  05/30/15 0735  NA 138  < > 133*  K 4.2  < > 4.7  CL 102  < > 98*  CO2 30  < > 30  GLUCOSE 90  < > 102*  BUN 23*  < > 29*  CREATININE 0.84  < > 0.97  CALCIUM 8.9  < > 8.8*  AST 38  --   --   ALT 21  --   --   ALKPHOS 52  --   --   BILITOT 0.9  --   --   < > = values in this interval not displayed.  Cardiac Enzymes  Recent Labs Lab 05/28/15 1002  TROPONINI 0.12*    Microbiology Results  No results found for this or any previous visit.  RADIOLOGY:  No results found.  EKG:   Orders placed or  performed during the hospital encounter of 05/27/15  . ED EKG  . ED EKG  . EKG 12-Lead  . EKG 12-Lead      Management plans discussed with the patient, family and they are in agreement.  CODE STATUS:     Code Status Orders        Start     Ordered   05/27/15 1957  Full code   Continuous     05/27/15 1956      TOTAL TIME TAKING CARE OF THIS PATIENT: 40 minutes.    Katharina CaperVAICKUTE,Matalie Romberger M.D on 05/30/2015 at 9:31 AM  Between 7am to 6pm - Pager - [phone]  After 6pm go to [email or web address] - password [email or web address] [facility]  [facility]  Office  [phone]  CC: Primary care physician; No primary care provider on file.

## 2015-05-30 NOTE — Clinical Social Work Placement (Signed)
   CLINICAL SOCIAL WORK PLACEMENT  NOTE  Date:  05/30/2015  Patient Details  Name: Alex FreibergHerbert Hill MRN: [id number] Date of Birth: 01/04/1946  Clinical Social Work is seeking post-discharge placement for this patient at the Skilled  Nursing Facility level of care (*CSW will initial, date and re-position this form in  chart as items are completed):  Yes   Patient/family provided with [name] of facilities offering this level of care within the geographic area requested by the patient (or if unable, by the patient's family).  Yes   Patient/family informed of their freedom to choose among providers that offer the needed level of care, that participate in Medicare, Medicaid or managed care program needed by the patient, have an available bed and are willing to accept the patient.  Yes   Patient/family informed of [facility]'s ownership interest in [facility] and [facility], as well as of the fact that they are under no obligation to receive care at these facilities.  PASRR submitted to EDS on 05/28/15     PASRR number received on 05/28/15     Existing PASRR number confirmed on       FL2 transmitted to all facilities in geographic area requested by pt/family on 05/28/15     FL2 transmitted to all facilities within larger geographic area on       Patient informed that his/her managed care company has contracts with or will negotiate with certain facilities, including the following:        Yes   Patient/family informed of bed offers received.  Patient chooses bed at  [facility])     Physician recommends and patient chooses bed at      Patient to be transferred to  ([facility]) on 05/30/15.  Patient to be transferred to facility by  (Daughter , Alex Hill)     Patient family notified on 05/30/15 of transfer.  Name of family member notified:   (Daughter Alex Hill)     PHYSICIAN Please sign FL2     Additional  Comment:    _______________________________________________ Soundra PilonMoore, Annelisa Ryback H, LCSW 05/30/2015, 11:37 AM

## 2015-05-30 NOTE — Progress Notes (Signed)
Report called to Konrad DoloresKim Hicks at [facility].

## 2015-05-30 NOTE — Progress Notes (Signed)
Pt discharged to [facility] via wc.  Instructions daughter.  Questions answered.  No distress.

## 2015-06-13 ENCOUNTER — Ambulatory Visit: Payer: No Typology Code available for payment source | Attending: Family | Admitting: Family

## 2015-06-13 ENCOUNTER — Encounter: Payer: Self-pay | Admitting: Family

## 2015-06-13 VITALS — BP 129/49 | HR 59 | Resp 20 | Ht 71.0 in | Wt 145.0 lb

## 2015-06-13 DIAGNOSIS — Z720 Tobacco use: Secondary | ICD-10-CM

## 2015-06-13 DIAGNOSIS — F17210 Nicotine dependence, cigarettes, uncomplicated: Secondary | ICD-10-CM | POA: Diagnosis not present

## 2015-06-13 DIAGNOSIS — I10 Essential (primary) hypertension: Secondary | ICD-10-CM | POA: Insufficient documentation

## 2015-06-13 DIAGNOSIS — R079 Chest pain, unspecified: Secondary | ICD-10-CM | POA: Insufficient documentation

## 2015-06-13 DIAGNOSIS — R0602 Shortness of breath: Secondary | ICD-10-CM | POA: Insufficient documentation

## 2015-06-13 DIAGNOSIS — I5022 Chronic systolic (congestive) heart failure: Secondary | ICD-10-CM | POA: Diagnosis present

## 2015-06-13 DIAGNOSIS — Z79899 Other long term (current) drug therapy: Secondary | ICD-10-CM | POA: Diagnosis not present

## 2015-06-13 DIAGNOSIS — Z7982 Long term (current) use of aspirin: Secondary | ICD-10-CM | POA: Insufficient documentation

## 2015-06-13 DIAGNOSIS — R0789 Other chest pain: Secondary | ICD-10-CM

## 2015-06-13 MED ORDER — FUROSEMIDE 40 MG PO TABS: 40.0000 mg | ORAL_TABLET | Freq: Every day | ORAL | Status: AC

## 2015-06-13 MED ORDER — POTASSIUM CHLORIDE CRYS ER 20 MEQ PO TBCR: 20.0000 meq | EXTENDED_RELEASE_TABLET | Freq: Every day | ORAL | Status: AC

## 2015-06-13 NOTE — Patient Instructions (Signed)
Continue weighing daily and call for an overnight weight gain of > 2 pounds or a weekly weight gain of >5 pounds. 

## 2015-06-13 NOTE — Progress Notes (Signed)
Subjective:    Patient ID: Alex Hill, male    DOB: 01/28/1946, 69 y.o.   MRN: [id number]  Congestive Heart Failure Presents for initial visit. The disease course has been stable. Associated symptoms include edema, fatigue and shortness of breath (worse in the mornings). Pertinent negatives include no abdominal pain, orthopnea, palpitations or paroxysmal nocturnal dyspnea. Chest pain: in the mornings. The symptoms have been stable. Past treatments include ACE inhibitors, beta blockers and salt and fluid restriction. The treatment provided moderate relief. Compliance with prior treatments has been good. His past medical history is significant for HTN. Compliance with total regimen is 76-100%.  Other This is a recurrent (edema) problem. The current episode started 1 to 4 weeks ago. The problem occurs constantly. The problem has been gradually worsening. Associated symptoms include fatigue. Pertinent negatives include no abdominal pain, congestion, coughing, diaphoresis, headaches, nausea, neck pain, sore throat, vomiting or weakness. Chest pain: in the mornings. The symptoms are aggravated by walking and standing. He has tried position changes for the symptoms. The treatment provided mild relief.  Chest Pain  This is a chronic problem. The current episode started 1 to 4 weeks ago. The onset quality is gradual. The problem occurs daily. The problem has been unchanged. The pain is present in the lateral region. The pain is at a severity of 2/10. The pain is mild. The quality of the pain is described as dull. The pain does not radiate. Associated symptoms include lower extremity edema and shortness of breath (worse in the mornings). Pertinent negatives include no abdominal pain, back pain, cough, diaphoresis, dizziness, headaches, irregular heartbeat, leg pain, nausea, palpitations, vomiting or weakness. The pain is aggravated by nothing. He has tried nothing for the symptoms. Risk factors include being  elderly, lack of exercise, male gender and smoking/tobacco exposure.  His past medical history is significant for CHF, HTN and hypertension.    Past Medical History  Diagnosis Date  . Hypertension   . Hepatitis A   . CHF (congestive heart failure) [facility](HCC)     Past Surgical History  Procedure Laterality Date  . Appendectomy    . Laceration repair      Family History  Problem Relation Age of Onset  . Cancer Mother   . Alzheimer's disease Father   . Cancer Maternal Grandmother     Social History  Substance Use Topics  . Smoking status: Current Every Day Smoker -- 0.25 packs/day for 50 years    Types: Cigarettes  . Smokeless tobacco: Not on file  . Alcohol Use: No    No Known Allergies  Prior to Admission medications   Medication Sig Start Date End Date Taking? Authorizing Provider  aspirin 81 MG tablet Take 81 mg by mouth daily.   Yes Historical Provider, MD  carvedilol (COREG) 12.5 MG tablet Take 12.5 mg by mouth 2 (two) times daily with a meal.   Yes Historical Provider, MD  lisinopril (PRINIVIL,ZESTRIL) 10 MG tablet Take 10 mg by mouth daily.   Yes Historical Provider, MD  LORazepam (ATIVAN) 0.5 MG tablet Take 0.5 mg by mouth every 8 (eight) hours as needed for anxiety.   Yes Historical Provider, MD  traZODone (DESYREL) 50 MG tablet Take 50 mg by mouth at bedtime.   Yes Historical Provider, MD  furosemide (LASIX) 40 MG tablet Take 1 tablet (40 mg total) by mouth daily. 06/13/15   Delma Freezeina A Hackney, FNP  potassium chloride SA (K-DUR,KLOR-CON) 20 MEQ tablet Take 1 tablet (20 mEq total) by  mouth daily. 06/13/15   Delma Freeze, FNP     Review of Systems  Constitutional: Positive for fatigue. Negative for diaphoresis and appetite change.  HENT: Negative for congestion, postnasal drip and sore throat.   Eyes: Negative.   Respiratory: Positive for shortness of breath (worse in the mornings). Negative for cough and chest tightness.   Cardiovascular: Positive for leg swelling  (getting worse). Negative for palpitations. Chest pain: in the mornings.  Gastrointestinal: Negative for nausea, vomiting, abdominal pain and abdominal distention.  Endocrine: Negative.   Genitourinary: Negative.   Musculoskeletal: Negative for back pain and neck pain.  Skin: Negative.   Allergic/Immunologic: Negative.   Neurological: Negative for dizziness, weakness, light-headedness and headaches.  Hematological: Negative for adenopathy. Does not bruise/bleed easily.  Psychiatric/Behavioral: Positive for sleep disturbance (since [place]). Negative for dysphoric mood. The patient is not nervous/anxious.        Objective:   Physical Exam  Constitutional: He is oriented to person, place, and time. He appears well-developed and well-nourished.  HENT:  Head: Normocephalic and atraumatic.  Eyes: Conjunctivae are normal. Pupils are equal, round, and reactive to light.  Swelling noted under both eyes   Neck: Normal range of motion. Neck supple.  Cardiovascular: Regular rhythm.  Bradycardia present.   Pulmonary/Chest: Effort normal. He has no wheezes. He has no rales.  Abdominal: Soft. He exhibits no distension. There is no tenderness.  Musculoskeletal: He exhibits edema (2+ pitting edema in bilateral lower legs to mid-shin). He exhibits no tenderness.  Neurological: He is alert and oriented to person, place, and time.  Skin: Skin is warm and dry.  Psychiatric: He has a normal mood and affect. His behavior is normal. Thought content normal.  Nursing note and vitals reviewed.   BP 129/49 mmHg  Pulse 59  Resp 20  Ht  (1.803 m)  Wt 145 lb (65.772 kg)  BMI 20.23 kg/m2  SpO2 100%       Assessment & Plan:  1: Chronic heart failure with reduced ejection fraction- Patient presents with some shortness of breath and fatigue upon exertion. When he does start to get tired or short of breath, he will stop what he's doing to rest until his symptoms improve. He has noticed that his feet  are getting more swollen as well as having some swelling under both of his eyes. Was not discharged on a diuretic. He does get weighed at the facility and says that his last weight was 141.5 pounds. He is 145 pounds here today. He admits to using quite a bit of salt as well as eating salty foods like country ham, BBQ etc. Discussed the importance of following a  sodium diet and written information was given to him. Will place patient on furosemide  daily along with klor-con daily. He will need lab work drawn in one week to assess his renal function and potassium level. 2: HTN- Blood pressure looks good here. 3: Chest pain- Patient thinks this is gas as once he starts moving around some in the mornings, the "pain" resolves. Encouraged him to followup with his cardiologist should this pattern change or worsen. 4: Tobacco use- Patient says that he smokes about 1 pack of cigarettes every three days. Discussed complete cessation for 3 minutes with him.   Patient is moving to [place] to go live with his son. He will be leaving [facility] on 06/16/15 to travel with his son. He says that his son already has a PCP and cardiologist  appointment set up and wants to also get physical therapy involved as well.

## 2017-08-17 IMAGING — CT CT HEAD W/O CM
2 series · 16 of 30 positions shown, 20 images · non-contrast
Comparison: None.

CLINICAL DATA: Syncope and right facial droop

EXAM:
CT HEAD WITHOUT CONTRAST
TECHNIQUE: Contiguous axial images were obtained from the base of the skull
through the vertex without intravenous contrast.

[Series 2: soft tissue · axial · 0.42mm/px · z∈[-70,+50]mm · 13 of 30 slices shown, 17 images]
[im 3/30  brain]
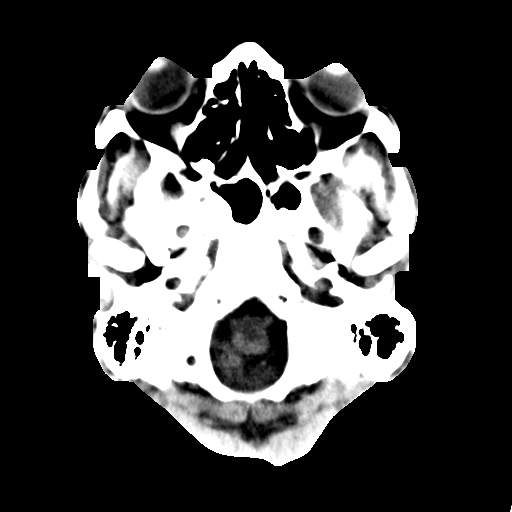
[im 3/30  bone]
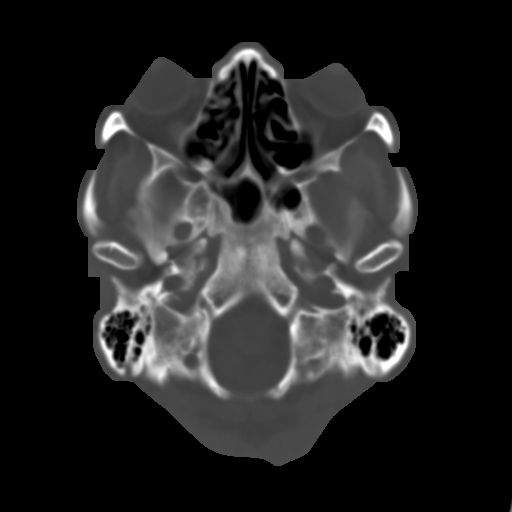
[im 5/30  brain]
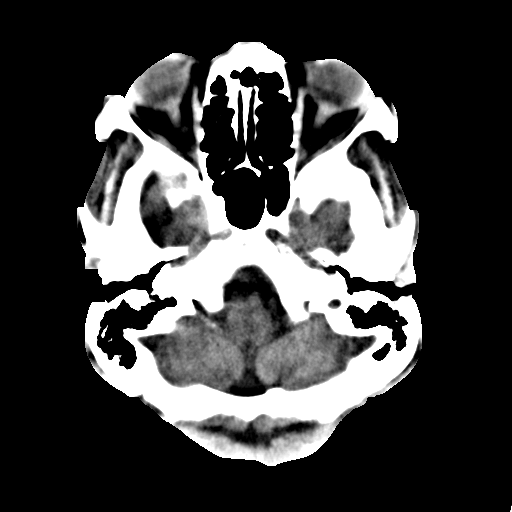
[im 7/30  brain]
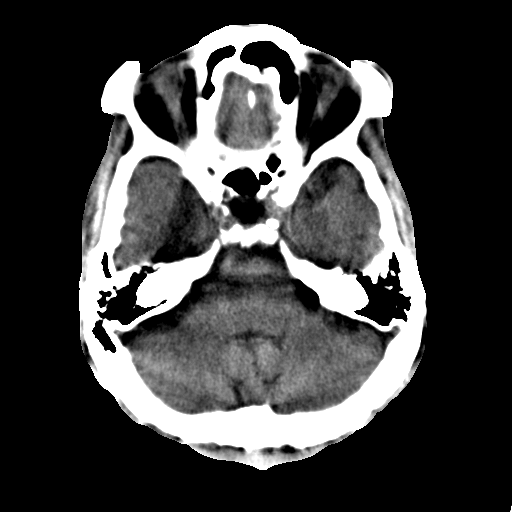
[im 9/30  brain]
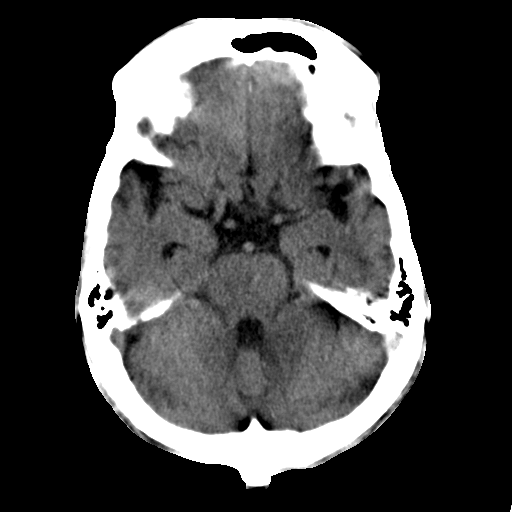
[im 11/30  brain]
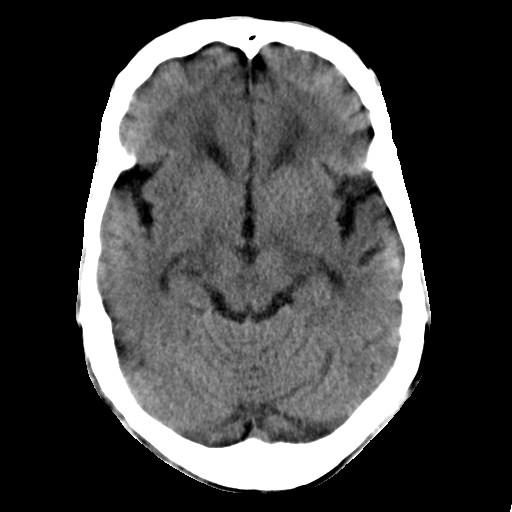
[im 11/30  bone]
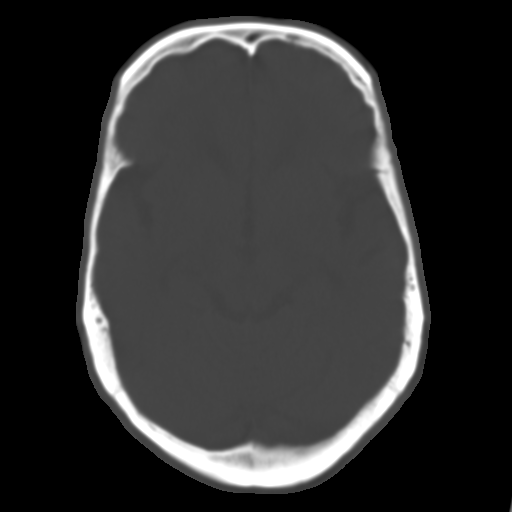
[im 13/30  brain]
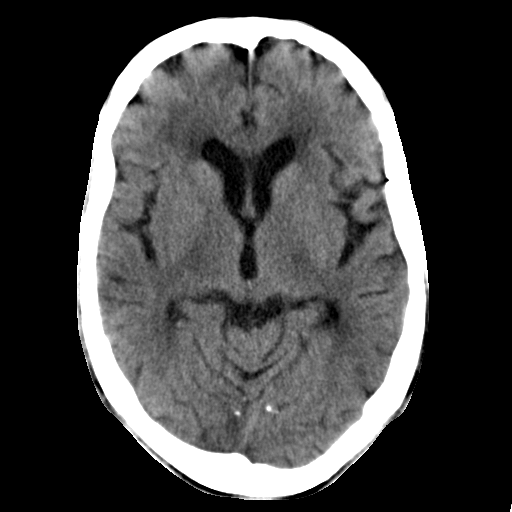
[im 15/30  brain]
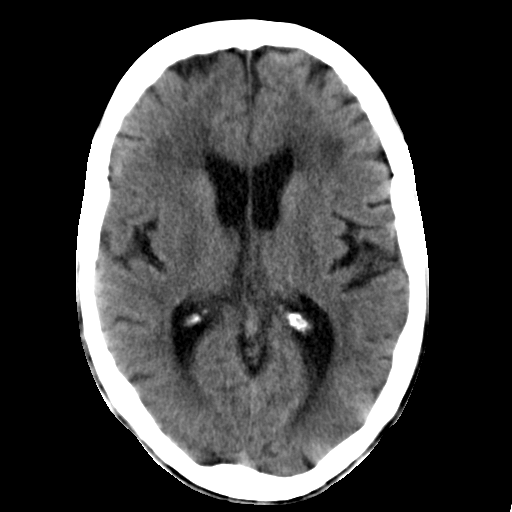
[im 17/30  brain]
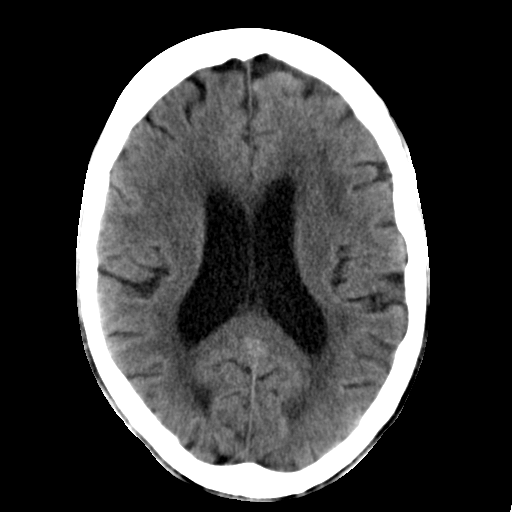
[im 19/30  brain]
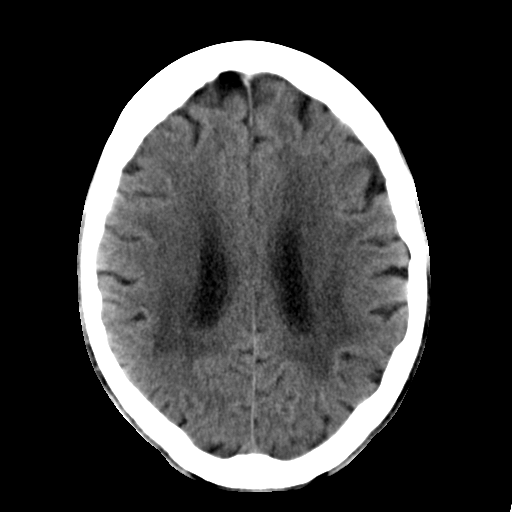
[im 19/30  bone]
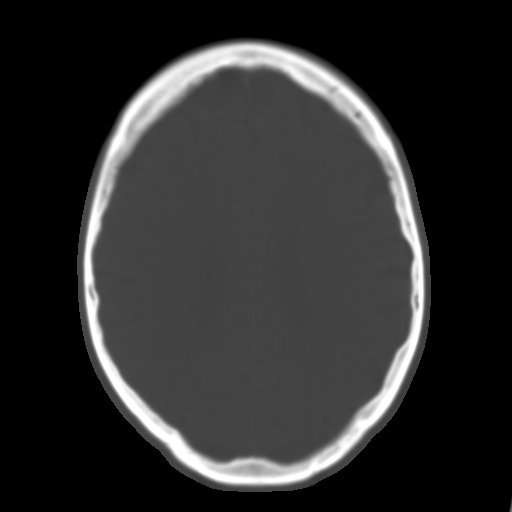
[im 21/30  brain]
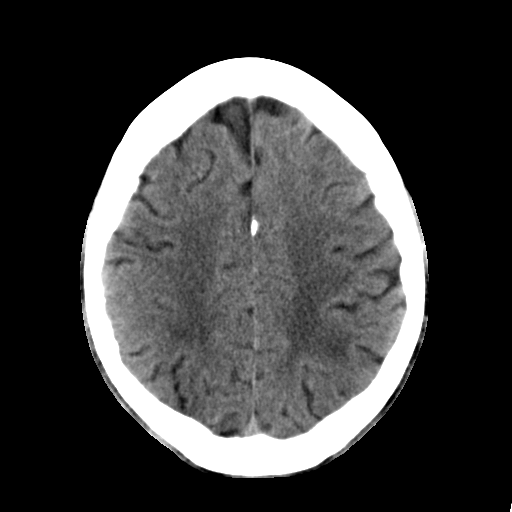
[im 23/30  brain]
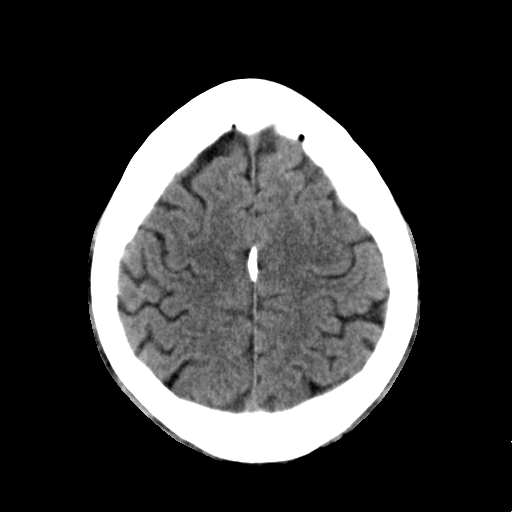
[im 25/30  brain]
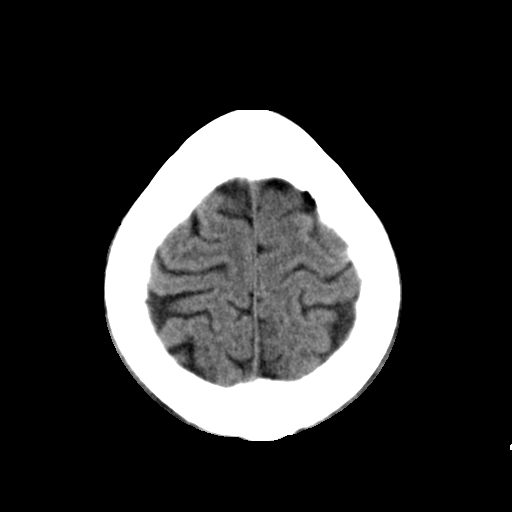
[im 27/30  brain]
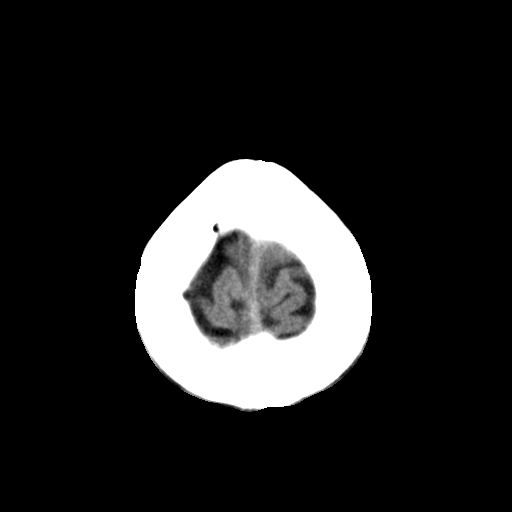
[im 27/30  bone]
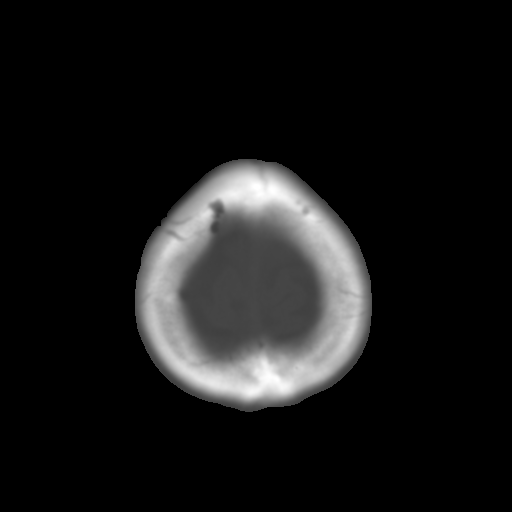

[Series 4: soft tissue recon · axial · 0.42mm/px · z∈[-49,-9]mm · 3 of 31 slices shown]
[im 3/31  brain]
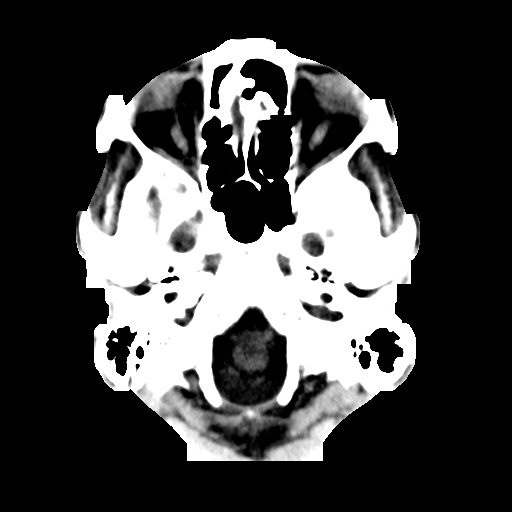
[im 7/31  brain]
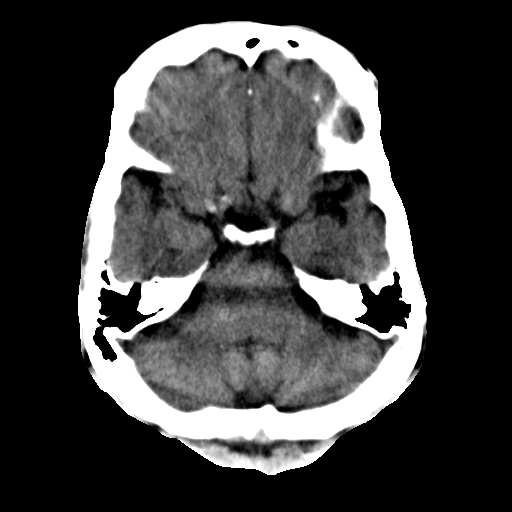
[im 11/31  brain]
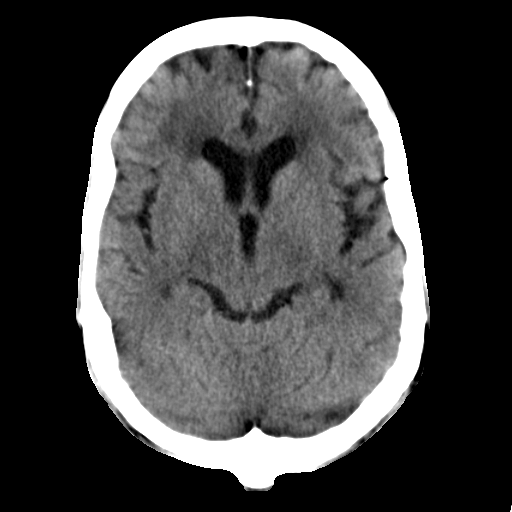

[16 of 30 positions shown; findings below may reference images not displayed]

FINDINGS: Skull and Sinuses:Negative for fracture or destructive process. The
mastoids, middle ears, and imaged paranasal sinuses are clear.

Orbits: No acute abnormality.

Brain: No evidence of acute infarction, hemorrhage, hydrocephalus,
or mass lesion/mass effect. Notable given history of syncope, note
that there is moderate streak artifact across the pons, limiting
evaluation.

Moderate for age low-density in the bilateral cerebral white matter
consistent with chronic small vessel disease in this patient with
hypertension and smoking history.
IMPRESSION: 1. No acute finding.
2. Extensive chronic microvascular ischemia.

## 2017-08-17 IMAGING — CR DG CHEST 1V PORT
1 series · 1 of 1 positions shown · non-contrast
Comparison: Portable exam 2000 hours without priors for comparison.

CLINICAL DATA: Syncope, hypertension, smoker

EXAM:
PORTABLE CHEST 1 VIEW

[ap]
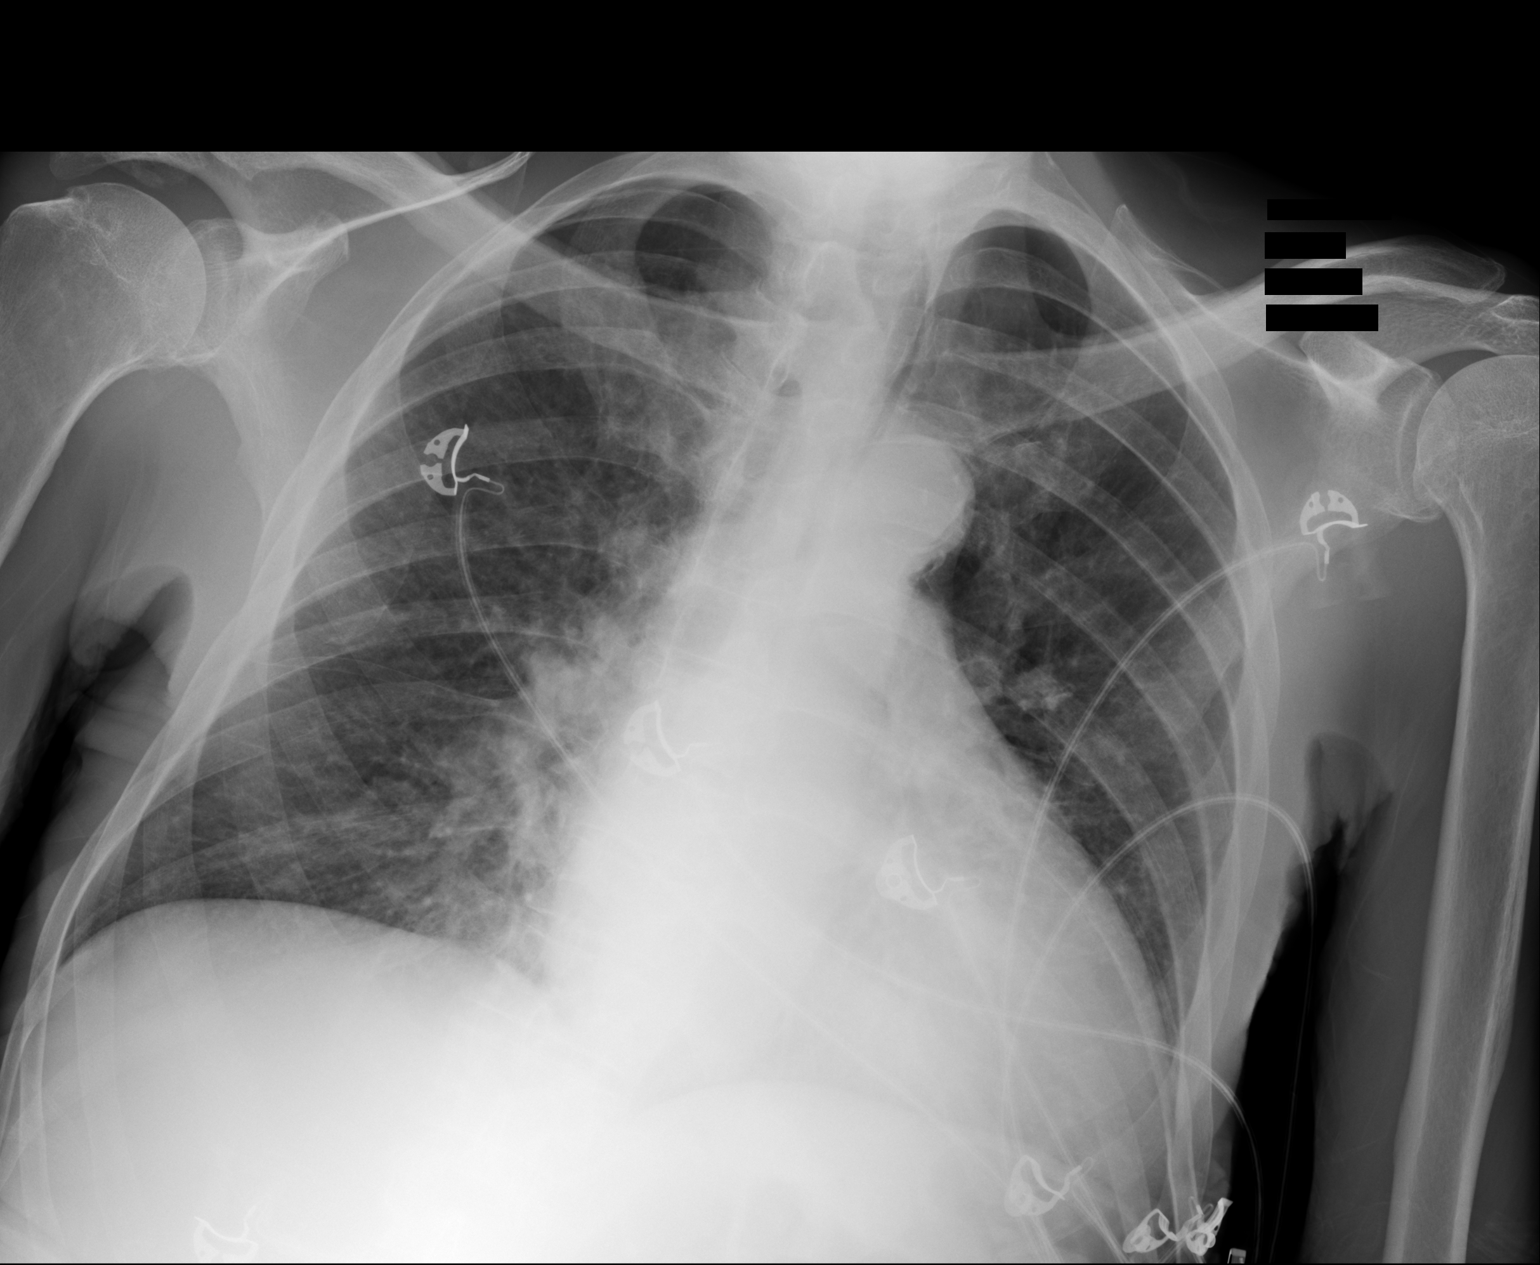

[1 of 1 positions shown; findings below may reference images not displayed]

FINDINGS: Slight rotation to the LEFT.

Enlargement of cardiac silhouette.

Atherosclerotic calcification aorta.

Mediastinal contours and pulmonary vascularity otherwise normal.

Question mild atelectasis versus infiltrate at medial RIGHT lung
base.

Lungs otherwise clear.

No pleural effusion or pneumothorax.
IMPRESSION: Enlargement of cardiac silhouette.

Questionable mild atelectasis versus infiltrate at medial RIGHT lung
base.

## 2021-07-09 DEATH — deceased
